# Patient Record
Sex: Female | Born: 1995 | Race: Black or African American | Hispanic: No | Marital: Single | Smoking: Never smoker
Health system: Southern US, Community
[De-identification: ages and names within clinical notes are randomized; demographics above are authoritative.]

## PROBLEM LIST (undated history)

## (undated) DIAGNOSIS — Z87442 Personal history of urinary calculi: Secondary | ICD-10-CM

## (undated) DIAGNOSIS — J189 Pneumonia, unspecified organism: Secondary | ICD-10-CM

## (undated) DIAGNOSIS — Z789 Other specified health status: Secondary | ICD-10-CM

## (undated) HISTORY — PX: WISDOM TOOTH EXTRACTION: SHX21

---
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
## 2019-09-15 ENCOUNTER — Emergency Department (HOSPITAL_COMMUNITY): Payer: PRIVATE HEALTH INSURANCE

## 2019-09-15 ENCOUNTER — Emergency Department (HOSPITAL_COMMUNITY)
Admission: EM | Admit: 2019-09-15 | Discharge: 2019-09-15 | Disposition: A | Discharge status: Home / Self Care | Payer: PRIVATE HEALTH INSURANCE | Attending: Emergency Medicine | Admitting: Emergency Medicine

## 2019-09-15 ENCOUNTER — Other Ambulatory Visit: Payer: Self-pay

## 2019-09-15 ENCOUNTER — Encounter (HOSPITAL_COMMUNITY): Payer: Self-pay

## 2019-09-15 DIAGNOSIS — Z79899 Other long term (current) drug therapy: Secondary | ICD-10-CM | POA: Diagnosis not present

## 2019-09-15 DIAGNOSIS — B3731 Acute candidiasis of vulva and vagina: Secondary | ICD-10-CM

## 2019-09-15 DIAGNOSIS — B373 Candidiasis of vulva and vagina: Secondary | ICD-10-CM

## 2019-09-15 DIAGNOSIS — R102 Pelvic and perineal pain: Secondary | ICD-10-CM | POA: Insufficient documentation

## 2019-09-15 DIAGNOSIS — N201 Calculus of ureter: Secondary | ICD-10-CM | POA: Insufficient documentation

## 2019-09-15 DIAGNOSIS — R1031 Right lower quadrant pain: Secondary | ICD-10-CM | POA: Diagnosis present

## 2019-09-15 LAB — COMPREHENSIVE METABOLIC PANEL
ALT: 11 U/L (ref 0–44)
AST: 16 U/L (ref 15–41)
Albumin: 3.9 g/dL (ref 3.5–5.0)
Alkaline Phosphatase: 45 U/L (ref 38–126)
Anion gap: 13 (ref 5–15)
BUN: 9 mg/dL (ref 6–20)
CO2: 24 mmol/L (ref 22–32)
Calcium: 8.9 mg/dL (ref 8.9–10.3)
Chloride: 104 mmol/L (ref 98–111)
Creatinine, Ser: 0.87 mg/dL (ref 0.44–1.00)
GFR calc Af Amer: >60 mL/min (ref >60)
GFR calc non Af Amer: >60 mL/min (ref >60)
Glucose, Bld: 103 mg/dL — ABNORMAL HIGH (ref 70–99)
Potassium: 3.4 mmol/L — ABNORMAL LOW (ref 3.5–5.1)
Sodium: 141 mmol/L (ref 135–145)
Total Bilirubin: 0.8 mg/dL (ref 0.3–1.2)
Total Protein: 6.7 g/dL (ref 6.5–8.1)

## 2019-09-15 LAB — URINALYSIS, ROUTINE W REFLEX MICROSCOPIC
Bacteria, UA: NONE SEEN
Bilirubin Urine: NEGATIVE
Glucose, UA: NEGATIVE mg/dL
Ketones, ur: 5 mg/dL — AB
Nitrite: NEGATIVE
Protein, ur: 100 mg/dL — AB
Specific Gravity, Urine: 1.03 (ref 1.005–1.030)
pH: 5 (ref 5.0–8.0)

## 2019-09-15 LAB — I-STAT BETA HCG BLOOD, ED (MC, WL, AP ONLY): I-stat hCG, quantitative: <5 m[IU]/mL (ref <5)

## 2019-09-15 LAB — CBC
HCT: 34.9 % — ABNORMAL LOW (ref 36.0–46.0)
Hemoglobin: 11.6 g/dL — ABNORMAL LOW (ref 12.0–15.0)
MCH: 30.9 pg (ref 26.0–34.0)
MCHC: 33.2 g/dL (ref 30.0–36.0)
MCV: 93.1 fL (ref 80.0–100.0)
Platelets: 222 10*3/uL (ref 150–400)
RBC: 3.75 MIL/uL — ABNORMAL LOW (ref 3.87–5.11)
RDW: 11.9 % (ref 11.5–15.5)
WBC: 9 10*3/uL (ref 4.0–10.5)
nRBC: 0 % (ref 0.0–0.2)

## 2019-09-15 LAB — WET PREP, GENITAL
Clue Cells Wet Prep HPF POC: NONE SEEN
Sperm: NONE SEEN
Trich, Wet Prep: NONE SEEN

## 2019-09-15 LAB — LIPASE, BLOOD: Lipase: 22 U/L (ref 11–51)

## 2019-09-15 MED ORDER — KETOROLAC TROMETHAMINE 30 MG/ML IJ SOLN
30.0000 mg | Freq: Once | INTRAMUSCULAR | Status: AC
  Administered 2019-09-15: 30 mg via INTRAVENOUS
  Filled 2019-09-15: qty 1

## 2019-09-15 MED ORDER — HYDROCODONE-ACETAMINOPHEN 5-325 MG PO TABS: 1.0000 | ORAL_TABLET | ORAL | 0 refills | Status: DC | PRN

## 2019-09-15 MED ORDER — FLUCONAZOLE 150 MG PO TABS: 150.0000 mg | ORAL_TABLET | Freq: Every day | ORAL | 0 refills | Status: AC

## 2019-09-15 MED ORDER — IOHEXOL 300 MG/ML  SOLN
100.0000 mL | Freq: Once | INTRAMUSCULAR | Status: AC | PRN
  Administered 2019-09-15: 100 mL via INTRAVENOUS

## 2019-09-15 MED ORDER — ONDANSETRON HCL 4 MG/2ML IJ SOLN
4.0000 mg | Freq: Once | INTRAMUSCULAR | Status: AC
  Administered 2019-09-15: 4 mg via INTRAVENOUS
  Filled 2019-09-15: qty 2

## 2019-09-15 MED ORDER — MORPHINE SULFATE (PF) 4 MG/ML IV SOLN
4.0000 mg | Freq: Once | INTRAVENOUS | Status: AC
  Administered 2019-09-15: 4 mg via INTRAVENOUS
  Filled 2019-09-15: qty 1

## 2019-09-15 MED ORDER — SODIUM CHLORIDE 0.9% FLUSH: 3.0000 mL | Freq: Once | INTRAVENOUS | Status: DC

## 2019-09-15 MED ORDER — ONDANSETRON 4 MG PO TBDP: 4.0000 mg | ORAL_TABLET | Freq: Three times a day (TID) | ORAL | 0 refills | Status: DC | PRN

## 2019-09-15 NOTE — ED Notes (Signed)
Pt transported to CT ?

## 2019-09-15 NOTE — ED Notes (Signed)
Pt transported to US

## 2019-09-15 NOTE — ED Provider Notes (Signed)
[facility] EMERGENCY DEPARTMENT Provider Note   CSN: [id number] Arrival date & time: 09/15/19  5366     History Chief Complaint  Patient presents with  . Abdominal Pain    Yvette Henderson is a 24 y.o. female.  24yo female with RLQ abdominal pain onset 9PM last night, difficulty sleeping secondary to pain. Pain radiates to right lower back. Reports urinary frequency last week that resolved, no dysuria, no changes in bowel habits. Denies fevers, vaginal discharge, nausea, vomiting. Patient took IBU without improvement. Reports history of ovarian cyst in 2016. No prior abdominal surgeries.         Past Medical History:  Diagnosis Date  . Medical history non-contributory     Patient Active Problem List   Diagnosis Date Noted  . Depression with suicidal ideation 11/14/2014  . Suicide attempt by drug ingestion ([place]) 11/14/2014  . 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 Generalized anxiety disorder 11/14/2014    History reviewed. No pertinent surgical history.   OB History   No obstetric history on file.     No family history on file.  Social History   Tobacco Use  . Smoking status: Never Smoker  . Smokeless tobacco: Never Used  Substance Use Topics  . Alcohol use: No  . Drug use: No    Home Medications Prior to Admission medications   Medication Sig Start Date End Date Taking? Authorizing Provider  cetirizine (ZYRTEC) 10 MG tablet Take 10 mg by mouth daily.   Yes [provider]  ibuprofen (ADVIL) 200 MG tablet Take 400 mg by mouth every 6 (six) hours as needed for mild pain.   Yes [provider]  metroNIDAZOLE (METROGEL) 0.75 % vaginal gel Place 1 Applicatorful vaginally at bedtime. For 5 days 09/13/19  Yes [provider]  valACYclovir (VALTREX) 1000 MG tablet Take 1,000 mg by mouth daily. 07/08/19  Yes [provider]  fluconazole (DIFLUCAN) 150 MG tablet Take 1 tablet (150 mg total) by  mouth daily for 1 dose. 09/15/19 09/16/19  Tacy Learn, PA-C  HYDROcodone-acetaminophen (NORCO/VICODIN) 5-325 MG tablet Take 1 tablet by mouth every 4 (four) hours as needed. 09/15/19   Tacy Learn, PA-C  ondansetron (ZOFRAN ODT) 4 MG disintegrating tablet Take 1 tablet (4 mg total) by mouth every 8 (eight) hours as needed for nausea or vomiting. 09/15/19   Tacy Learn, PA-C    Allergies    Amoxicillin  Review of Systems   Review of Systems  Constitutional: Negative for chills and fever.  Respiratory: Negative for shortness of breath.   Cardiovascular: Negative for chest pain.  Gastrointestinal: Positive for abdominal pain. Negative for constipation, diarrhea, nausea and vomiting.  Genitourinary: Positive for pelvic pain. Negative for dysuria and frequency.  Musculoskeletal: Positive for back pain.  Skin: Negative for rash and wound.  Allergic/Immunologic: Negative for immunocompromised state.  Neurological: Negative for weakness.  Hematological: Negative for adenopathy.  All other systems reviewed and are negative.   Physical Exam Updated Vital Signs BP 106/74   Pulse 68   Temp 97.9 F (36.6 C) (Oral)   Resp 12   Ht 5\' 6"  (1.676 m)   Wt 53.1 kg   SpO2 97%   BMI 18.88 kg/m   Physical Exam Vitals and nursing note reviewed. Exam conducted with a chaperone present.  Constitutional:      General: She is not in acute distress.    Appearance: She is well-developed. She is not diaphoretic.  HENT:     Head: Normocephalic and atraumatic.  Cardiovascular:     Rate and Rhythm: Normal rate and regular rhythm.     Heart sounds: Normal heart sounds.  Pulmonary:     Effort: Pulmonary effort is normal.     Breath sounds: Normal breath sounds.  Abdominal:     Palpations: Abdomen is soft.     Tenderness: There is abdominal tenderness in the right lower quadrant and suprapubic area. There is no right CVA tenderness or left CVA tenderness.  Genitourinary:    Vagina: Vaginal  discharge present.     Cervix: No cervical motion tenderness or friability.     Uterus: Normal. Not tender.      Adnexa:        Right: Tenderness present. No mass or fullness.         Left: No mass, tenderness or fullness.       Comments: Mild right adnexal tenderness, trace amount thick white discharge Skin:    General: Skin is warm and dry.     Findings: No rash.  Neurological:     Mental Status: She is alert and oriented to person, place, and time.  Psychiatric:        Behavior: Behavior normal.     ED Results / Procedures / Treatments   Labs (all labs ordered are listed, but only abnormal results are displayed) Labs Reviewed  WET PREP, GENITAL - Abnormal; Notable for the following components:      Result Value   Yeast Wet Prep HPF POC PRESENT (*)    WBC, Wet Prep HPF POC MODERATE (*)    All other components within normal limits  COMPREHENSIVE METABOLIC PANEL - Abnormal; Notable for the following components:   Potassium 3.4 (*)    Glucose, Bld 103 (*)    All other components within normal limits  CBC - Abnormal; Notable for the following components:   RBC 3.75 (*)    Hemoglobin 11.6 (*)    HCT 34.9 (*)    All other components within normal limits  URINALYSIS, ROUTINE W REFLEX MICROSCOPIC - Abnormal; Notable for the following components:   APPearance CLOUDY (*)    Hgb urine dipstick MODERATE (*)    Ketones, ur 5 (*)    Protein, ur 100 (*)    Leukocytes,Ua MODERATE (*)    All other components within normal limits  LIPASE, BLOOD  I-STAT BETA HCG BLOOD, ED ([facility], [facility], [facility] ONLY)  GC/CHLAMYDIA PROBE AMP ([place]) NOT AT [facility]    EKG None  Radiology CT Abdomen Pelvis W Contrast  Result Date: 09/15/2019 CLINICAL DATA:  Right lower quadrant abdominal pain. EXAM: CT ABDOMEN AND PELVIS WITH CONTRAST TECHNIQUE: Multidetector CT imaging of the abdomen and pelvis was performed using the standard protocol following bolus administration of intravenous contrast. CONTRAST:   OMNIPAQUE IOHEXOL 300 MG/ML  SOLN COMPARISON:  Pelvic ultrasound 09/15/2019 FINDINGS: Lower chest: Incidental imaging of the lung bases is normal. Hepatobiliary: Mild periportal edema. Patent portal vein. Patent patent veins. No pericholecystic stranding. No biliary ductal dilation. Pancreas: Pancreas is normal. No signs of inflammation or ductal dilation. Spleen: Spleen is normal size. Adrenals/Urinary Tract: Adrenal glands are normal. Moderate right hydroureteronephrosis with 3 mm calculus in the distal ureter at the UVJ on the right. Delayed enhancement of the right kidney relative to the left. Nonfocal perinephric stranding and fluid on the right, also Peri ureteral stranding. No evidence of left-sided hydronephrosis. Urinary bladder is normal. Stomach/Bowel: No signs of  bowel obstruction. Assessment of bowel loops is limited due to limited intra-abdominal fat. Stool in the ascending colon. Cecum in the pelvis. Appendix not visualized also with limited assessment due to paucity of intra-abdominal fat. No secondary signs to suggest acute appendicitis. Vascular/Lymphatic: Vascular structures in the abdomen are patent. No signs of aneurysm or significant atherosclerosis. No adenopathy in the upper abdomen or retroperitoneum. No adenopathy in the pelvis. Reproductive: CT appearance of uterus and adnexa is unremarkable. Other: Small free fluid in the pelvis, nonspecific. Musculoskeletal: No acute bone finding or evidence of destructive bone process. IMPRESSION: Moderate right hydroureteronephrosis with 3 mm calculus in the distal ureter at the UVJ on the right, obstruction associated with delayed enhancement of the right kidney relative to the left. Electronically Signed   By: Donzetta Kohut M.D.   On: 09/15/2019 11:18   US PELVIC COMPLETE W TRANSVAGINAL AND TORSION R/O  Result Date: 09/15/2019 CLINICAL DATA:  Right lower quadrant pain since last 9 EXAM: TRANSABDOMINAL AND TRANSVAGINAL ULTRASOUND OF PELVIS  DOPPLER ULTRASOUND OF OVARIES TECHNIQUE: Both transabdominal and transvaginal ultrasound examinations of the pelvis were performed. Transabdominal technique was performed for global imaging of the pelvis including uterus, ovaries, adnexal regions, and pelvic cul-de-sac. It was necessary to proceed with endovaginal exam following the transabdominal exam to visualize the left and right ovaries. Color and duplex Doppler ultrasound was utilized to evaluate blood flow to the ovaries. COMPARISON:  04/07/2014 FINDINGS: Uterus Measurements: 6.4 x 2.9 x 3.8 cm = volume: 37 mL. No fibroids or other mass visualized. Endometrium Thickness: 6 mm.  No focal abnormality visualized. Right ovary Measurements: 4.1 x 1.8 x 2.4 cm = volume: 9 mL. Multiple small follicles. Normal appearance/no adnexal mass. Left ovary Measurements: 3.6 x 2.1 x 2.6 cm = volume: 9 mL. Multiple small follicles. Normal appearance/no adnexal mass. Pulsed Doppler evaluation of both ovaries demonstrates normal low-resistance arterial and venous waveforms. Other findings No abnormal free fluid. IMPRESSION: No ultrasound abnormality of the pelvis to explain right lower quadrant pain. Consider CT or MRI to further evaluate otherwise unexplained abdominal pain. Electronically Signed   By: Lauralyn Primes M.D.   On: 09/15/2019 09:22    Procedures Procedures (including critical care time)  Medications Ordered in ED Medications  ondansetron (ZOFRAN) injection 4 mg (4 mg Intravenous Given 09/15/19 0811)  morphine 4 MG/ML injection 4 mg (4 mg Intravenous Given 09/15/19 0811)  iohexol (OMNIPAQUE) 300 MG/ML solution 100 mL (100 mLs Intravenous Contrast Given 09/15/19 1101)  morphine 4 MG/ML injection 4 mg (4 mg Intravenous Given 09/15/19 1124)  ketorolac (TORADOL) 30 MG/ML injection 30 mg (30 mg Intravenous Given 09/15/19 1210)    ED Course  I have reviewed the triage vital signs and the nursing notes.  Pertinent labs & imaging results that were available  during my care of the patient were reviewed by me and considered in my medical decision making (see chart for details).  Clinical Course as of Sep 16 2010  Thu Sep 15, 2019  5754 24 year old female presents with complaint of right lower quadrant pain onset 9 AM last night.  On exam, was tenderness in the right lower quadrant, no CVA tenderness.  On pelvic exam has mild amount of white discharge, wet prep is positive for yeast, will treat with Diflucan.  CBC without significant changes.  Urinalysis with moderate hemoglobin, ketones, protein leukocytes urinary symptoms.  Urinalysis without acute findings to explain patient's pain.  CT abdomen pelvis with contrast, appendix not visualized however no signs of  acute funisitis.  Does show 3 mm distal UVJ stone with with hydro-.  Patient will be given Toradol for better pain control, if tolerating p.o. symptom control she may be discharged with urology as needed.  Prescription for Norco, Zofran for pain and nausea.  Given return to ER precautions.   [LM]  0920 US PELVIC COMPLETE W TRANSVAGINAL AND TORSION R/O [ES]    Clinical Course User Index [ES] Myer Haff, Student-PA [LM] Eulah Pont Barbaraann Boys   MDM Rules/Calculators/A&P                      Final Clinical Impression(s) / ED Diagnoses Final diagnoses:  Ureterolithiasis  Vaginal yeast infection    Rx / DC Orders ED Discharge Orders         Ordered    HYDROcodone-acetaminophen (NORCO/VICODIN) 5-325 MG tablet  Every 4 hours PRN     09/15/19 1135    ondansetron (ZOFRAN ODT) 4 MG disintegrating tablet  Every 8 hours PRN     09/15/19 1135    fluconazole (DIFLUCAN) 150 MG tablet  Daily     09/15/19 1139           Alden Hipp 09/16/19 2013    Mesner, Barbara Cower, MD 09/17/19 [phone]

## 2019-09-15 NOTE — ED Triage Notes (Signed)
Pt c/o of lower abd pain that started last night, denies n/v/d, denies urinary symptoms or discharge.

## 2019-09-15 NOTE — ED Notes (Signed)
Pt d/c home per MD order. Discharge summary reviewed with pt, pt verbalizes understanding. Off unit via [facility] with nursing staff. Pt mother discharge ride home.

## 2019-09-15 NOTE — Discharge Instructions (Signed)
Take Zofran as needed as prescribed for nausea and vomiting. Take Norco as needed as prescribed for pain. Do not drive or operate machinery if taking Norco. Norco and Zofran can cause constipation, take Colace as needed.  Follow up with urology if pain is not improving. Return to the ER for fever, worsening pain, pain or vomiting not controlled with the medications given.

## 2019-09-15 NOTE — ED Notes (Signed)
Patient transported back from CT 

## 2019-09-15 NOTE — ED Notes (Signed)
Pelvic cart outside of pt. room

## 2019-09-17 LAB — GC/CHLAMYDIA PROBE AMP (~~LOC~~) NOT AT ARMC
Chlamydia: NEGATIVE
Neisseria Gonorrhea: NEGATIVE

## 2019-10-05 ENCOUNTER — Other Ambulatory Visit: Payer: Self-pay | Admitting: Urology

## 2019-10-07 NOTE — Patient Instructions (Signed)
DUE TO COVID-19 ONLY ONE VISITOR IS ALLOWED TO COME WITH YOU AND STAY IN THE WAITING ROOM ONLY DURING PRE OP AND PROCEDURE DAY OF SURGERY. THE 1 VISITOR MAY VISIT WITH YOU AFTER SURGERY IN YOUR PRIVATE ROOM DURING VISITING HOURS ONLY!  YOU NEED TO HAVE A COVID 19 TEST ON_4/15______ @_2 :20______, THIS TEST MUST BE DONE BEFORE SURGERY, COME  [place] , [address].  ([place]) ONCE YOUR COVID TEST IS COMPLETED, PLEASE BEGIN THE QUARANTINE INSTRUCTIONS AS OUTLINED IN YOUR HANDOUT.                Yvette Henderson    Your procedure is scheduled on: 10/17/19   Report to [facility] Main  Entrance   Report to admitting at  2:00 PM     Call this number if you have problems the morning of surgery [phone]    Remember: Do not eat food or drink liquids :After Midnight.   BRUSH YOUR TEETH MORNING OF SURGERY AND RINSE YOUR MOUTH OUT, NO CHEWING GUM CANDY OR MINTS.     Take these medicines the morning of surgery with A SIP OF WATER: Valtrex, Zyrtec                                 You may not have any metal on your body including hair pins and              piercings  Do not wear jewelry, make-up, lotions, powders or perfumes, deodorant             Do not wear nail polish on your fingernails.  Do not shave  48 hours prior to surgery.     Do not bring valuables to the hospital. [place].  Contacts, dentures or bridgework may not be worn into surgery.       Patients discharged the day of surgery will not be allowed to drive home . IF YOU ARE HAVING SURGERY AND GOING HOME THE SAME DAY, YOU MUST HAVE AN ADULT TO DRIVE YOU HOME AND BE WITH YOU FOR 24 HOURS . YOU MAY GO HOME BY TAXI OR UBER OR ORTHERWISE, BUT AN ADULT MUST ACCOMPANY YOU HOME AND STAY WITH YOU FOR 24 HOURS.  Name and phone number of your driver:  Special Instructions: N/A              Please read over the following fact sheets you were  given: _____________________________________________________________________             [facility] - Preparing for Surgery Before surgery, you can play an important role.  Because skin is not sterile, your skin needs to be as free of germs as possible.  You can reduce the number of germs on your skin by washing with CHG (chlorahexidine gluconate) soap before surgery.  CHG is an antiseptic cleaner which kills germs and bonds with the skin to continue killing germs even after washing. Please DO NOT use if you have an allergy to CHG or antibacterial soaps.  If your skin becomes reddened/irritated stop using the CHG and inform your nurse when you arrive at Short Stay. Do not shave (including legs and underarms) for at least 48 hours prior to the first CHG shower.  You may shave your face/neck. Please follow  these instructions carefully:  1.  Shower with CHG Soap the night before surgery and the  morning of Surgery.  2.  If you choose to wash your hair, wash your hair first as usual with your  normal  shampoo.  3.  After you shampoo, rinse your hair and body thoroughly to remove the  shampoo.                                        4.  Use CHG as you would any other liquid soap.  You can apply chg directly  to the skin and wash                       Gently with a scrungie or clean washcloth.  5.  Apply the CHG Soap to your body ONLY FROM THE NECK DOWN.   Do not use on face/ open                           Wound or open sores. Avoid contact with eyes, ears mouth and genitals (private parts).                       Wash face,  Genitals (private parts) with your normal soap.             6.  Wash thoroughly, paying special attention to the area where your surgery  will be performed.  7.  Thoroughly rinse your body with warm water from the neck down.  8.  DO NOT shower/wash with your normal soap after using and rinsing off  the CHG Soap.                9.  Pat yourself dry with a clean towel.             10.  Wear clean pajamas.            11.  Place clean sheets on your bed the night of your first shower and do not  sleep with pets. Day of Surgery : Do not apply any lotions/deodorants the morning of surgery.  Please wear clean clothes to the hospital/surgery center.  FAILURE TO FOLLOW THESE INSTRUCTIONS MAY RESULT IN THE CANCELLATION OF YOUR SURGERY PATIENT SIGNATURE_________________________________  NURSE SIGNATURE__________________________________  ________________________________________________________________________

## 2019-10-10 ENCOUNTER — Encounter (HOSPITAL_COMMUNITY)
Admission: RE | Admit: 2019-10-10 | Discharge: 2019-10-10 | Disposition: A | Discharge status: Home / Self Care | Payer: PRIVATE HEALTH INSURANCE | Source: Ambulatory Visit | Attending: Urology | Admitting: Urology

## 2019-10-10 ENCOUNTER — Encounter (HOSPITAL_COMMUNITY): Admission: RE | Admit: 2019-10-10 | Payer: PRIVATE HEALTH INSURANCE | Source: Ambulatory Visit

## 2019-10-10 NOTE — Progress Notes (Signed)
Pt did not answer her phone for the PAT phone call. I waited for her to come in for her lab visit but she was a no Show. I called  Dr. Vevelyn Royals office to let them know and gave the chart to the scheduler to reschedule appts.

## 2019-10-12 ENCOUNTER — Encounter (HOSPITAL_COMMUNITY)
Admission: RE | Admit: 2019-10-12 | Discharge: 2019-10-12 | Disposition: A | Discharge status: Home / Self Care | Payer: PRIVATE HEALTH INSURANCE | Source: Ambulatory Visit | Attending: Urology | Admitting: Urology

## 2019-10-12 ENCOUNTER — Encounter (HOSPITAL_COMMUNITY): Payer: Self-pay

## 2019-10-12 ENCOUNTER — Other Ambulatory Visit: Payer: Self-pay

## 2019-10-12 HISTORY — DX: Pneumonia, unspecified organism: J18.9

## 2019-10-12 HISTORY — DX: Personal history of urinary calculi: Z87.442

## 2019-10-12 NOTE — Pre-Procedure Instructions (Signed)
DUE TO COVID-19 ONLY ONE VISITOR IS ALLOWED TO COME WITH YOU AND STAY IN THE WAITING ROOM ONLY DURING PRE OP AND PROCEDURE DAY OF SURGERY. THE 1 VISITOR MAY VISIT WITH YOU AFTER SURGERY IN YOUR PRIVATE ROOM DURING VISITING HOURS ONLY!  YOU NEED TO HAVE A COVID 19 TEST ON_______ @_______ , THIS TEST MUST BE DONE BEFORE SURGERY, COME  [address], [place] , .  [facility]) ONCE YOUR COVID TEST IS COMPLETED, PLEASE BEGIN THE QUARANTINE INSTRUCTIONS AS OUTLINED IN YOUR HANDOUT.                Yvette Henderson  10/12/2019   Your procedure is scheduled on:  10/17/2019   Report to [facility] Main  Entrance   Report to admitting at   200pm     Call this number if you have problems the morning of surgery [phone]    Remember: Do not eat food   :After Midnight May have clear liquids from 12 midnite until 12noon.  06-09-1996 BRUSH YOUR TEETH MORNING OF SURGERY AND RINSE YOUR MOUTH OUT, NO CHEWING GUM CANDY OR MINTS.     Take these medicines the morning of surgery with A SIP OF WATER: none                                  You may not have any metal on your body including hair pins and              piercings  Do not wear jewelry, make-up, lotions, powders or perfumes, deodorant             Do not wear nail polish on your fingernails.  Do not shave  48 hours prior to surgery.                 Do not bring valuables to the hospital. [place] IS NOT             RESPONSIBLE   FOR VALUABLES.  Contacts, dentures or bridgework may not be worn into surgery.      Patients discharged the day of surgery will not be allowed to drive home. IF YOU ARE HAVING SURGERY AND GOING HOME THE SAME DAY, YOU MUST HAVE AN ADULT TO DRIVE YOU HOME AND BE WITH YOU FOR 24 HOURS. YOU MAY GO HOME BY TAXI OR UBER OR ORTHERWISE, BUT AN ADULT MUST ACCOMPANY YOU HOME AND STAY WITH YOU FOR 24 HOURS.  Name and phone number of your driver: mother - Jiselle Sheu- [phone]                Please  read over the following fact sheets you were given: _____________________________________________________________________                CLEAR LIQUID DIET   Foods Allowed                                                                     Foods Excluded  Coffee and tea, regular and decaf  liquids that you cannot  Plain Jell-O any favor except red or purple                                           see through such as: Fruit ices (not with fruit pulp)                                     milk, soups, orange juice  Iced Popsicles                                    All solid food Carbonated beverages, regular and diet                                    Cranberry, grape and apple juices Sports drinks like Gatorade Lightly seasoned clear broth or consume(fat free) Sugar, honey syrup  Sample Menu Breakfast                                Lunch                                     Supper Cranberry juice                    Beef broth                            Chicken broth Jell-O                                     Grape juice                           Apple juice Coffee or tea                        Jell-O                                      Popsicle                                                Coffee or tea                        Coffee or tea  _____________________________________________________________________  [facility] - Preparing for Surgery Before surgery, you can play an important role.  Because skin is not sterile, your skin needs to be as free of germs as possible.  You can reduce the number of germs on your skin by washing with CHG (chlorahexidine gluconate) soap before surgery.  CHG is an antiseptic cleaner which kills  germs and bonds with the skin to continue killing germs even after washing. Please DO NOT use if you have an allergy to CHG or antibacterial soaps.  If your skin becomes reddened/irritated stop using the CHG and inform your nurse  when you arrive at Short Stay. Do not shave (including legs and underarms) for at least 48 hours prior to the first CHG shower.  You may shave your face/neck. Please follow these instructions carefully:  1.  Shower with CHG Soap the night before surgery and the  morning of Surgery.  2.  If you choose to wash your hair, wash your hair first as usual with your  normal  shampoo.  3.  After you shampoo, rinse your hair and body thoroughly to remove the  shampoo.                           4.  Use CHG as you would any other liquid soap.  You can apply chg directly  to the skin and wash                       Gently with a scrungie or clean washcloth.  5.  Apply the CHG Soap to your body ONLY FROM THE NECK DOWN.   Do not use on face/ open                           Wound or open sores. Avoid contact with eyes, ears mouth and genitals (private parts).                       Wash face,  Genitals (private parts) with your normal soap.             6.  Wash thoroughly, paying special attention to the area where your surgery  will be performed.  7.  Thoroughly rinse your body with warm water from the neck down.  8.  DO NOT shower/wash with your normal soap after using and rinsing off  the CHG Soap.                9.  Pat yourself dry with a clean towel.            10.  Wear clean pajamas.            11.  Place clean sheets on your bed the night of your first shower and do not  sleep with pets. Day of Surgery : Do not apply any lotions/deodorants the morning of surgery.  Please wear clean clothes to the hospital/surgery center.  FAILURE TO FOLLOW THESE INSTRUCTIONS MAY RESULT IN THE CANCELLATION OF YOUR SURGERY PATIENT SIGNATURE_________________________________  NURSE SIGNATURE__________________________________  ________________________________________________________________________

## 2019-10-12 NOTE — Patient Instructions (Signed)
DUE TO COVID-19 ONLY ONE VISITOR IS ALLOWED TO COME WITH YOU AND STAY IN THE WAITING ROOM ONLY DURING PRE OP AND PROCEDURE DAY OF SURGERY. THE 1 VISITOR MAY VISIT WITH YOU AFTER SURGERY IN YOUR PRIVATE ROOM DURING VISITING HOURS ONLY!  YOU NEED TO HAVE A COVID 19 TEST ON_______ @_______ , THIS TEST MUST BE DONE BEFORE SURGERY, COME  [name] , [address].  ([place]) ONCE YOUR COVID TEST IS COMPLETED, PLEASE BEGIN THE QUARANTINE INSTRUCTIONS AS OUTLINED IN YOUR HANDOUT.                Yvette Henderson  10/12/2019   Your procedure is scheduled on: 10/17/2019    Report to [facility] Main  Entrance   Report to admitting at  200pm     Call this number if you have problems the morning of surgery [phone]    Remember: Do not eat food  :After Midnight.may have clear liquids from 55midnite until 1200noon day of surgery.   BRUSH YOUR TEETH MORNING OF SURGERY AND RINSE YOUR MOUTH OUT, NO CHEWING GUM CANDY OR MINTS.     Take these medicines the morning of surgery with A SIP OF WATER: none                                  You may not have any metal on your body including hair pins and              piercings  Do not wear jewelry, make-up, lotions, powders or perfumes, deodorant             Do not wear nail polish on your fingernails.  Do not shave  48 hours prior to surgery.              Do not bring valuables to the hospital. [place].  Contacts, dentures or bridgework may not be worn into surgery.      Patients discharged the day of surgery will not be allowed to drive home. IF YOU ARE HAVING SURGERY AND GOING HOME THE SAME DAY, YOU MUST HAVE AN ADULT TO DRIVE YOU HOME AND BE WITH YOU FOR 24 HOURS. YOU MAY GO HOME BY TAXI OR UBER OR ORTHERWISE, BUT AN ADULT MUST ACCOMPANY YOU HOME AND STAY WITH YOU FOR 24 HOURS.  Name and phone number of your driver:mother Cerra Eisenhower - [phone]                 Please read over the following fact sheets you were given: _____________________________________________________________________                CLEAR LIQUID DIET   Foods Allowed                                                                     Foods Excluded  Coffee and tea, regular and decaf  liquids that you cannot  Plain Jell-O any favor except red or purple                                           see through such as: Fruit ices (not with fruit pulp)                                     milk, soups, orange juice  Iced Popsicles                                    All solid food Carbonated beverages, regular and diet                                    Cranberry, grape and apple juices Sports drinks like Gatorade Lightly seasoned clear broth or consume(fat free) Sugar, honey syrup  Sample Menu Breakfast                                Lunch                                     Supper Cranberry juice                    Beef broth                            Chicken broth Jell-O                                     Grape juice                           Apple juice Coffee or tea                        Jell-O                                      Popsicle                                                Coffee or tea                        Coffee or tea  _____________________________________________________________________  [facility] - Preparing for Surgery Before surgery, you can play an important role.  Because skin is not sterile, your skin needs to be as free of germs as possible.  You can reduce the number of germs on your skin by washing with CHG (chlorahexidine gluconate) soap before surgery.  CHG is an antiseptic cleaner which kills  germs and bonds with the skin to continue killing germs even after washing. Please DO NOT use if you have an allergy to CHG or antibacterial soaps.  If your skin becomes reddened/irritated stop using the CHG and inform your  nurse when you arrive at Short Stay. Do not shave (including legs and underarms) for at least 48 hours prior to the first CHG shower.  You may shave your face/neck. Please follow these instructions carefully:  1.  Shower with CHG Soap the night before surgery and the  morning of Surgery.  2.  If you choose to wash your hair, wash your hair first as usual with your  normal  shampoo.  3.  After you shampoo, rinse your hair and body thoroughly to remove the  shampoo.                           4.  Use CHG as you would any other liquid soap.  You can apply chg directly  to the skin and wash                       Gently with a scrungie or clean washcloth.  5.  Apply the CHG Soap to your body ONLY FROM THE NECK DOWN.   Do not use on face/ open                           Wound or open sores. Avoid contact with eyes, ears mouth and genitals (private parts).                       Wash face,  Genitals (private parts) with your normal soap.             6.  Wash thoroughly, paying special attention to the area where your surgery  will be performed.  7.  Thoroughly rinse your body with warm water from the neck down.  8.  DO NOT shower/wash with your normal soap after using and rinsing off  the CHG Soap.                9.  Pat yourself dry with a clean towel.            10.  Wear clean pajamas.            11.  Place clean sheets on your bed the night of your first shower and do not  sleep with pets. Day of Surgery : Do not apply any lotions/deodorants the morning of surgery.  Please wear clean clothes to the hospital/surgery center.  FAILURE TO FOLLOW THESE INSTRUCTIONS MAY RESULT IN THE CANCELLATION OF YOUR SURGERY PATIENT SIGNATURE_________________________________  NURSE SIGNATURE__________________________________  ________________________________________________________________________

## 2019-10-13 ENCOUNTER — Other Ambulatory Visit (HOSPITAL_COMMUNITY)
Admission: RE | Admit: 2019-10-13 | Discharge: 2019-10-13 | Disposition: A | Discharge status: Home / Self Care | Payer: PRIVATE HEALTH INSURANCE | Source: Ambulatory Visit | Attending: Urology | Admitting: Urology

## 2019-10-13 ENCOUNTER — Encounter (HOSPITAL_COMMUNITY)
Admission: RE | Admit: 2019-10-13 | Discharge: 2019-10-13 | Disposition: A | Discharge status: Home / Self Care | Payer: PRIVATE HEALTH INSURANCE | Source: Ambulatory Visit | Attending: Urology | Admitting: Urology

## 2019-10-13 DIAGNOSIS — Z20822 Contact with and (suspected) exposure to covid-19: Secondary | ICD-10-CM | POA: Diagnosis not present

## 2019-10-13 DIAGNOSIS — Z01812 Encounter for preprocedural laboratory examination: Secondary | ICD-10-CM | POA: Insufficient documentation

## 2019-10-13 LAB — CBC
HCT: 35.7 % — ABNORMAL LOW (ref 36.0–46.0)
Hemoglobin: 11.9 g/dL — ABNORMAL LOW (ref 12.0–15.0)
MCH: 30.5 pg (ref 26.0–34.0)
MCHC: 33.3 g/dL (ref 30.0–36.0)
MCV: 91.5 fL (ref 80.0–100.0)
Platelets: 214 10*3/uL (ref 150–400)
RBC: 3.9 MIL/uL (ref 3.87–5.11)
RDW: 12 % (ref 11.5–15.5)
WBC: 8.3 10*3/uL (ref 4.0–10.5)
nRBC: 0 % (ref 0.0–0.2)

## 2019-10-13 LAB — BASIC METABOLIC PANEL
Anion gap: 7 (ref 5–15)
BUN: 10 mg/dL (ref 6–20)
CO2: 29 mmol/L (ref 22–32)
Calcium: 8.5 mg/dL — ABNORMAL LOW (ref 8.9–10.3)
Chloride: 103 mmol/L (ref 98–111)
Creatinine, Ser: 0.65 mg/dL (ref 0.44–1.00)
GFR calc Af Amer: >60 mL/min (ref >60)
GFR calc non Af Amer: >60 mL/min (ref >60)
Glucose, Bld: 98 mg/dL (ref 70–99)
Potassium: 4.3 mmol/L (ref 3.5–5.1)
Sodium: 139 mmol/L (ref 135–145)

## 2019-10-13 LAB — SARS CORONAVIRUS 2 (TAT 6-24 HRS): SARS Coronavirus 2: NEGATIVE

## 2019-10-13 LAB — HCG, SERUM, QUALITATIVE: Preg, Serum: NEGATIVE

## 2019-10-14 NOTE — H&P (Signed)
Office Visit Report     10/05/2019   --------------------------------------------------------------------------------   Yvette Henderson  MRN: [id number]  DOB: Jan 19, 1996, 24 year old Female  SSN:    PRIMARY CARE:  [facility], [place]  REFERRING:    PROVIDER:  Raynelle Bring, M.D.  LOCATION:  [facility] [phone]     --------------------------------------------------------------------------------   CC/HPI: Right distal ureteral calculus   Yvette Henderson returns today 2 weeks after her last visit. She has been completely asymptomatic and denies any right-sided flank or abdominal pain, worsening urinary symptoms, hematuria, nausea/vomiting, or fever. She follows up today with a KUB x-ray. She has been reliably straining her urine and has not seen a stone pass.     ALLERGIES: Amoxicillin - Nausea, Vomiting    MEDICATIONS: Tamsulosin Hcl 0.4 mg capsule 1 capsule PO Q HS  Ibuprofen     GU PSH: None     PSH Notes: Wisdom Teeth removal    NON-GU PSH: None   GU PMH: Ureteral calculus - 09/21/2019    NON-GU PMH: Depression    FAMILY HISTORY: Diabetes - Grandmother, Grandfather Heart Disease - Grandfather Hypertension - Grandmother, Grandfather, Mother, Father seizures - Grandmother Strokes - Grandmother   SOCIAL HISTORY: Marital Status: Single Preferred Language: English; Ethnicity: Not Hispanic Or Latino; Race: Black or African American Current Smoking Status: Patient has never smoked.   Tobacco Use Assessment Completed: Used Tobacco in last 30 days? Drinks 5 drinks per month. Social Drinker.  Drinks 1 caffeinated drink per day. Patient's occupation is/was [employer].    REVIEW OF SYSTEMS:    GU Review Female:   Patient denies frequent urination, hard to postpone urination, burning /pain with urination, get up at night to urinate, leakage of urine, stream starts and stops, trouble starting your stream, have to strain to urinate, and currently pregnant.   Gastrointestinal (Lower):   Patient denies diarrhea and constipation.  Gastrointestinal (Upper):   Patient denies nausea and vomiting.  Constitutional:   Patient denies fever, night sweats, weight loss, and fatigue.  Skin:   Patient denies skin rash/ lesion and itching.  Eyes:   Patient denies double vision and blurred vision.  Ears/ Nose/ Throat:   Patient denies sore throat and sinus problems.  Hematologic/Lymphatic:   Patient denies swollen glands and easy bruising.  Cardiovascular:   Patient denies leg swelling and chest pains.  Respiratory:   Patient denies cough and shortness of breath.  Endocrine:   Patient denies excessive thirst.  Musculoskeletal:   Patient denies back pain and joint pain.  Neurological:   Patient denies headaches and dizziness.  Psychologic:   Patient denies depression and anxiety.   VITAL SIGNS:      10/05/2019 10:55 AM  Weight 117 lb / 53.07 kg  Height 66 in / 167.64 cm  BP 111/72 mmHg  Pulse 76 /min  Temperature 98.0 F / 36.6 C  BMI 18.9 kg/m   MULTI-SYSTEM PHYSICAL EXAMINATION:    Constitutional: Well-nourished. No physical deformities. Normally developed. Good grooming.  Respiratory: No labored breathing, no use of accessory muscles.   Cardiovascular: Normal temperature, normal extremity pulses, no swelling, no varicosities.  Gastrointestinal: No mass, no tenderness, no rigidity, non obese abdomen. No CVA tenderness.     PAST DATA REVIEWED:  Source Of History:  Patient  Records Review:   Previous Patient Records  Urine Test Review:   Urinalysis  X-Ray Review: KUB: Reviewed Films.    Notes:  I independently reviewed her KUB x-ray. This does demonstrate a stable 3-4 mm calcification in the vicinity of the distal right ureter consistent with her known stone.   PROCEDURES:         KUB - [id number]  A single view of the abdomen is obtained.      Patient confirmed No Neulasta OnPro Device.            Urinalysis w/Scope -  81001 Dipstick Dipstick Cont'd Micro  Specimen: Voided Bilirubin: Neg WBC/hpf: 6 - 10/hpf  Color: Amber Ketones: 1+ RBC/hpf: 3 - 10/hpf  Appearance: Cloudy Blood: 1+ Bacteria: Moderate (26-50/hpf)  Specific Gravity: >= 1.030 Protein: 2+ Cystals: Amorph Urates  pH: 6.0 Urobilinogen: 1.0 Casts: NS (Not Seen)  Glucose: Neg Nitrites: Neg Trichomonas: Not Present    Leukocyte Esterase: Trace Mucous: Present      Epithelial Cells: 20 - 40/hpf      Yeast: NS (Not Seen)      Sperm: Not Present         Urinalysis w/Scope - 81001 Dipstick Dipstick Cont'd Micro  Color: Amber Bilirubin: Neg mg/dL WBC/hpf: 6 - 77/AJO  Appearance: Slightly Cloudy Ketones: Neg mg/dL RBC/hpf: 3 - 87/OMV  Specific Gravity: 1.030 Blood: 1+ ery/uL Bacteria: Mod (26-50/hpf)  pH: 6.0 Protein: 2+ mg/dL Cystals: Amorph Urates  Glucose: Neg mg/dL Urobilinogen: 1.0 mg/dL Casts: NS (Not Seen)    Nitrites: Neg Trichomonas: Not Present    Leukocyte Esterase: Trace leu/uL Mucous: Present      Epithelial Cells: 20 - 40/hpf      Yeast: NS (Not Seen)      Sperm: Not Present    ASSESSMENT:      ICD-10 Details  1 GU:   Ureteral calculus - N20.1    PLAN:           Orders Labs Urine Culture          Schedule Return Visit/Planned Activity: Other See Visit Notes             Note: Will call to schedule surgery.          Document Letter(s):  Created for Patient: Clinical Summary         Notes:   1. Distal right ureteral calculus: Her stone remains persistently present. We have discussed proceeding with treatment if she is unable to pass her stone. Fortunately, she is not symptomatic at this time in so we will plan to proceed with treatment in the next few weeks but give her additional time to try to pass her stone in the meantime. We reviewed options for treatment including shockwave lithotripsy and ureteroscopic treatment. We reviewed the pros and cons of each of these approaches. Ultimately, I did recommend ureteroscopic  stone removal with possible laser lithotripsy and stent placement if necessary. We reviewed this procedure in detail including the potential risks, complications, and expected recovery process. Her urine will be cultured today although likely represents contamination.   Cc: Rueben Bash, PA        Next Appointment:      Next Appointment: 10/17/2019 04:00 PM    Appointment Type: Surgery     Location: [facility] [phone]    Provider: Heloise Purpura, M.D.    Reason for Visit: WL/OP CYSTO, RT RPG, RT URS STONE REMOVAL POSS LL, POSS RT UR STENT      * Signed by Heloise Purpura, M.D. on 10/05/19 at 4:28 PM (EDT)*

## 2019-10-17 ENCOUNTER — Encounter (HOSPITAL_COMMUNITY)
Admission: RE | Disposition: A | Discharge status: Home / Self Care | Payer: Self-pay | Source: Home / Self Care | Attending: Urology

## 2019-10-17 ENCOUNTER — Encounter (HOSPITAL_COMMUNITY): Payer: Self-pay | Admitting: Urology

## 2019-10-17 ENCOUNTER — Ambulatory Visit (HOSPITAL_COMMUNITY)
Admission: RE | Admit: 2019-10-17 | Discharge: 2019-10-17 | Disposition: A | Discharge status: Home / Self Care | Payer: PRIVATE HEALTH INSURANCE | Attending: Urology | Admitting: Urology

## 2019-10-17 ENCOUNTER — Ambulatory Visit (HOSPITAL_COMMUNITY): Payer: PRIVATE HEALTH INSURANCE | Admitting: Certified Registered"

## 2019-10-17 ENCOUNTER — Ambulatory Visit (HOSPITAL_COMMUNITY): Payer: PRIVATE HEALTH INSURANCE

## 2019-10-17 DIAGNOSIS — N201 Calculus of ureter: Secondary | ICD-10-CM | POA: Insufficient documentation

## 2019-10-17 HISTORY — PX: CYSTOSCOPY/URETEROSCOPY/HOLMIUM LASER/STENT PLACEMENT: SHX6546

## 2019-10-17 SURGERY — CYSTOSCOPY/URETEROSCOPY/HOLMIUM LASER/STENT PLACEMENT
Anesthesia: General | Laterality: Right

## 2019-10-17 MED ORDER — PROPOFOL 10 MG/ML IV BOLUS
INTRAVENOUS | Status: AC
  Filled 2019-10-17: qty 20

## 2019-10-17 MED ORDER — EPHEDRINE 5 MG/ML INJ
INTRAVENOUS | Status: AC
  Filled 2019-10-17: qty 10

## 2019-10-17 MED ORDER — PROPOFOL 10 MG/ML IV BOLUS
INTRAVENOUS | Status: DC | PRN
  Administered 2019-10-17: 150 mg via INTRAVENOUS

## 2019-10-17 MED ORDER — FENTANYL CITRATE (PF) 100 MCG/2ML IJ SOLN
INTRAMUSCULAR | Status: DC | PRN
  Administered 2019-10-17: 100 ug via INTRAVENOUS

## 2019-10-17 MED ORDER — IOHEXOL 300 MG/ML  SOLN
INTRAMUSCULAR | Status: DC | PRN
  Administered 2019-10-17: 17:00:00 10 mL

## 2019-10-17 MED ORDER — ACETAMINOPHEN 10 MG/ML IV SOLN: 1000.0000 mg | Freq: Once | INTRAVENOUS | Status: DC | PRN

## 2019-10-17 MED ORDER — DEXAMETHASONE SODIUM PHOSPHATE 10 MG/ML IJ SOLN
INTRAMUSCULAR | Status: DC | PRN
  Administered 2019-10-17: 10 mg via INTRAVENOUS

## 2019-10-17 MED ORDER — PHENYLEPHRINE 40 MCG/ML (10ML) SYRINGE FOR IV PUSH (FOR BLOOD PRESSURE SUPPORT)
PREFILLED_SYRINGE | INTRAVENOUS | Status: AC
  Filled 2019-10-17: qty 10

## 2019-10-17 MED ORDER — PHENYLEPHRINE 40 MCG/ML (10ML) SYRINGE FOR IV PUSH (FOR BLOOD PRESSURE SUPPORT)
PREFILLED_SYRINGE | INTRAVENOUS | Status: DC | PRN
  Administered 2019-10-17: 120 ug via INTRAVENOUS

## 2019-10-17 MED ORDER — ACETAMINOPHEN 325 MG PO TABS: 325.0000 mg | ORAL_TABLET | Freq: Once | ORAL | Status: DC | PRN

## 2019-10-17 MED ORDER — PROMETHAZINE HCL 25 MG/ML IJ SOLN: 6.2500 mg | INTRAMUSCULAR | Status: DC | PRN

## 2019-10-17 MED ORDER — MIDAZOLAM HCL 2 MG/2ML IJ SOLN
INTRAMUSCULAR | Status: AC
  Filled 2019-10-17: qty 2

## 2019-10-17 MED ORDER — LIDOCAINE 2% (20 MG/ML) 5 ML SYRINGE
INTRAMUSCULAR | Status: DC | PRN
  Administered 2019-10-17: 80 mg via INTRAVENOUS

## 2019-10-17 MED ORDER — DEXAMETHASONE SODIUM PHOSPHATE 10 MG/ML IJ SOLN
INTRAMUSCULAR | Status: AC
  Filled 2019-10-17: qty 1

## 2019-10-17 MED ORDER — SCOPOLAMINE 1 MG/3DAYS TD PT72
MEDICATED_PATCH | TRANSDERMAL | Status: AC
  Filled 2019-10-17: qty 1

## 2019-10-17 MED ORDER — MIDAZOLAM HCL 5 MG/5ML IJ SOLN
INTRAMUSCULAR | Status: DC | PRN
  Administered 2019-10-17: 2 mg via INTRAVENOUS

## 2019-10-17 MED ORDER — FENTANYL CITRATE (PF) 100 MCG/2ML IJ SOLN: 25.0000 ug | INTRAMUSCULAR | Status: DC | PRN

## 2019-10-17 MED ORDER — CEFAZOLIN SODIUM-DEXTROSE 2-4 GM/100ML-% IV SOLN
2.0000 g | Freq: Once | INTRAVENOUS | Status: AC
  Administered 2019-10-17: 2 g via INTRAVENOUS
  Filled 2019-10-17: qty 100

## 2019-10-17 MED ORDER — ONDANSETRON HCL 4 MG/2ML IJ SOLN
INTRAMUSCULAR | Status: DC | PRN
  Administered 2019-10-17: 4 mg via INTRAVENOUS

## 2019-10-17 MED ORDER — LACTATED RINGERS IV SOLN: INTRAVENOUS | Status: DC

## 2019-10-17 MED ORDER — ONDANSETRON HCL 4 MG/2ML IJ SOLN
INTRAMUSCULAR | Status: AC
  Filled 2019-10-17: qty 2

## 2019-10-17 MED ORDER — LIDOCAINE 2% (20 MG/ML) 5 ML SYRINGE
INTRAMUSCULAR | Status: AC
  Filled 2019-10-17: qty 5

## 2019-10-17 MED ORDER — SCOPOLAMINE 1 MG/3DAYS TD PT72
MEDICATED_PATCH | TRANSDERMAL | Status: DC | PRN
  Administered 2019-10-17: 1 via TRANSDERMAL

## 2019-10-17 MED ORDER — ACETAMINOPHEN 160 MG/5ML PO SOLN: 325.0000 mg | Freq: Once | ORAL | Status: DC | PRN

## 2019-10-17 MED ORDER — MEPERIDINE HCL 50 MG/ML IJ SOLN: 6.2500 mg | INTRAMUSCULAR | Status: DC | PRN

## 2019-10-17 MED ORDER — FENTANYL CITRATE (PF) 100 MCG/2ML IJ SOLN
INTRAMUSCULAR | Status: AC
  Filled 2019-10-17: qty 2

## 2019-10-17 MED ORDER — SODIUM CHLORIDE 0.9 % IR SOLN
Status: DC | PRN
  Administered 2019-10-17: 3000 mL

## 2019-10-17 SURGICAL SUPPLY — 20 items
BAG URO CATCHER STRL LF (MISCELLANEOUS) ×3 IMPLANT
BASKET ZERO TIP NITINOL 2.4FR (BASKET) IMPLANT
BSKT STON RTRVL ZERO TP 2.4FR (BASKET)
CATH INTERMIT  6FR 70CM (CATHETERS) ×2 IMPLANT
CLOTH BEACON ORANGE TIMEOUT ST (SAFETY) ×3 IMPLANT
FIBER LASER FLEXIVA 365 (UROLOGICAL SUPPLIES) IMPLANT
FIBER LASER TRAC TIP (UROLOGICAL SUPPLIES) IMPLANT
GLOVE BIOGEL M STRL SZ7.5 (GLOVE) ×3 IMPLANT
GOWN STRL REUS W/TWL LRG LVL3 (GOWN DISPOSABLE) ×6 IMPLANT
GUIDEWIRE ANG ZIPWIRE 038X150 (WIRE) IMPLANT
GUIDEWIRE STR DUAL SENSOR (WIRE) ×3 IMPLANT
IV NS 1000ML (IV SOLUTION) ×3
IV NS 1000ML BAXH (IV SOLUTION) ×1 IMPLANT
KIT TURNOVER KIT A (KITS) IMPLANT
MANIFOLD NEPTUNE II (INSTRUMENTS) ×3 IMPLANT
PACK CYSTO (CUSTOM PROCEDURE TRAY) ×3 IMPLANT
SHEATH URETERAL 12FRX35CM (MISCELLANEOUS) IMPLANT
TUBING CONNECTING 10 (TUBING) ×2 IMPLANT
TUBING CONNECTING 10' (TUBING) ×1
TUBING UROLOGY SET (TUBING) ×3 IMPLANT

## 2019-10-17 NOTE — Transfer of Care (Signed)
Immediate Anesthesia Transfer of Care Note  Patient: Yvette Henderson  Procedure(s) Performed: CYSTOSCOPY, RETROGRADE PYELOGRAM, URETEROSCOPY (Right )  Patient Location: PACU  Anesthesia Type:General  Level of Consciousness: awake and alert   Airway & Oxygen Therapy: Patient Spontanous Breathing and Patient connected to face mask oxygen  Post-op Assessment: Report given to RN and Post -op Vital signs reviewed and stable  Post vital signs: Reviewed and stable  Last Vitals:  Vitals Value Taken Time  BP 112/69 10/17/19 1724  Temp    Pulse 90 10/17/19 1725  Resp 14 10/17/19 1725  SpO2 100 % 10/17/19 1725  Vitals shown include unvalidated device data.  Last Pain:  Vitals:   10/17/19 1432  TempSrc:   PainSc: 0-No pain         Complications: No apparent anesthesia complications

## 2019-10-17 NOTE — Discharge Instructions (Signed)
1. You may see some blood in the urine and may have some burning with urination for 48-72 hours. You also may notice that you have to urinate more frequently or urgently after your procedure which is normal.  °2. You should call should you develop an inability urinate, fever > 101, persistent nausea and vomiting that prevents you from eating or drinking to stay hydrated.  °

## 2019-10-17 NOTE — Anesthesia Postprocedure Evaluation (Signed)
Anesthesia Post Note  Patient: Yvette Henderson  Procedure(s) Performed: CYSTOSCOPY, RETROGRADE PYELOGRAM, URETEROSCOPY (Right )     Patient location during evaluation: PACU Anesthesia Type: General Level of consciousness: awake and alert Pain management: pain level controlled Vital Signs Assessment: post-procedure vital signs reviewed and stable Respiratory status: spontaneous breathing, nonlabored ventilation, respiratory function stable and patient connected to nasal cannula oxygen Cardiovascular status: blood pressure returned to baseline and stable Postop Assessment: no apparent nausea or vomiting Anesthetic complications: no    Last Vitals:  Vitals:   10/17/19 1724 10/17/19 1730  BP: 112/69 111/68  Pulse: 94 85  Resp: 17 16  Temp: 36.8 C   SpO2: 100% 100%    Last Pain:  Vitals:   10/17/19 1730  TempSrc:   PainSc: 0-No pain                 Samani Deal S

## 2019-10-17 NOTE — Anesthesia Procedure Notes (Signed)
Procedure Name: LMA Insertion Date/Time: 10/17/2019 4:46 PM Performed by: Minerva Ends, CRNA Pre-anesthesia Checklist: Patient identified, Emergency Drugs available, Suction available and Patient being monitored Patient Re-evaluated:Patient Re-evaluated prior to induction Oxygen Delivery Method: Circle System Utilized Preoxygenation: Pre-oxygenation with 100% oxygen Induction Type: IV induction Ventilation: Mask ventilation without difficulty LMA: LMA inserted LMA Size: 4.0 Number of attempts: 1 Airway Equipment and Method: Bite block Placement Confirmation: positive ETCO2 Tube secured with: Tape Dental Injury: Teeth and Oropharynx as per pre-operative assessment  Comments: By Grayling Congress CRNA

## 2019-10-17 NOTE — Anesthesia Preprocedure Evaluation (Addendum)
Anesthesia Evaluation  Patient identified by MRN, date of birth, ID band Patient awake    Reviewed: Allergy & Precautions, NPO status , Patient's Chart, lab work & pertinent test results  Airway Mallampati: I  TM Distance: >3 FB Neck ROM: Full    Dental  (+) Teeth Intact, Dental Advisory Given   Pulmonary    breath sounds clear to auscultation       Cardiovascular negative cardio ROS   Rhythm:Regular Rate:Normal     Neuro/Psych PSYCHIATRIC DISORDERS Anxiety Depression negative neurological ROS     GI/Hepatic negative GI ROS, Neg liver ROS,   Endo/Other  negative endocrine ROS  Renal/GU negative Renal ROS     Musculoskeletal negative musculoskeletal ROS (+)   Abdominal Normal abdominal exam  (+)   Peds  Hematology negative hematology ROS (+)   Anesthesia Other Findings   Reproductive/Obstetrics                            Anesthesia Physical Anesthesia Plan  ASA: II  Anesthesia Plan: General   Post-op Pain Management:    Induction: Intravenous  PONV Risk Score and Plan: 4 or greater and Ondansetron, Dexamethasone, Midazolam and Scopolamine patch - Pre-op  Airway Management Planned: LMA  Additional Equipment: None  Intra-op Plan:   Post-operative Plan: Extubation in OR  Informed Consent: I have reviewed the patients History and Physical, chart, labs and discussed the procedure including the risks, benefits and alternatives for the proposed anesthesia with the patient or authorized representative who has indicated his/her understanding and acceptance.     Dental advisory given  Plan Discussed with: CRNA  Anesthesia Plan Comments:        Anesthesia Quick Evaluation

## 2019-10-17 NOTE — Interval H&P Note (Signed)
History and Physical Interval Note:  10/17/2019 3:52 PM  Yvette Henderson  has presented today for surgery, with the diagnosis of RIGHT URETERAL CALCULUS.  The various methods of treatment have been discussed with the patient and family. After consideration of risks, benefits and other options for treatment, the patient has consented to  Procedure(s) with comments: CYSTOSCOPY/RETROGRADE/URETEROSCOPIC STONE REMOVAL/POSSIBLE HOLMIUM LASER/ POSSIBLE STENT PLACEMENT (Right) - ONLY NEEDS 45 MIN as a surgical intervention.  The patient's history has been reviewed, patient examined, no change in status, stable for surgery.  I have reviewed the patient's chart and labs.  Questions were answered to the patient's satisfaction.     [employer]

## 2019-10-17 NOTE — Anesthesia Procedure Notes (Signed)
Date/Time: 10/17/2019 5:15 PM Performed by: Minerva Ends, CRNA Oxygen Delivery Method: Simple face mask Placement Confirmation: breath sounds checked- equal and bilateral and positive ETCO2 Dental Injury: Teeth and Oropharynx as per pre-operative assessment

## 2019-10-17 NOTE — Op Note (Signed)
Preoperative diagnosis: Right distal ureteral calculus  Postoperative diagnosis: History of right ureteral calculus  Procedure:  1. Cystoscopy 2. Right ureteroscopy  3. Right retrograde pyelography with interpretation  Surgeon: Moody Bruins. M.D.  Anesthesia: General  Complications: None  Intraoperative findings: Right retrograde pyelography demonstrated no filling defects within the ureter or renal pelvis.  EBL: Minimal  Indication: Yvette Henderson is a 24 y.o. year old patient with urolithiasis. After reviewing the management options for treatment, the patient elected to proceed with the above surgical procedure(s). We have discussed the potential benefits and risks of the procedure, side effects of the proposed treatment, the likelihood of the patient achieving the goals of the procedure, and any potential problems that might occur during the procedure or recuperation. Informed consent has been obtained.  Description of procedure:  The patient was taken to the operating room and general anesthesia was induced.  The patient was placed in the dorsal lithotomy position, prepped and draped in the usual sterile fashion, and preoperative antibiotics were administered. A preoperative time-out was performed.   Cystourethroscopy was performed.  The patient's urethra was examined and was normal. The bladder was then systematically examined in its entirety. There was no evidence for any bladder tumors, stones, or other mucosal pathology.    Attention then turned to the right ureteral orifice and a ureteral catheter was used to intubate the ureteral orifice.  Omnipaque contrast was injected through the ureteral catheter and a retrograde pyelogram was performed with findings as dictated above.  A 0.38 sensor guidewire was then advanced up the right ureter into the renal pelvis under fluoroscopic guidance. The 6 Fr semirigid ureteroscope was then advanced into the ureter next to the guidewire.   No ureteral calculus was identified and the ureter was examined all the way up to the level of the UPJ.  The bladder was then emptied and the procedure ended.  The patient appeared to tolerate the procedure well and without complications.  The patient was able to be awakened and transferred to the recovery unit in satisfactory condition.

## 2019-10-17 NOTE — Progress Notes (Signed)
Pt discharged in NAD, VSS, no pain. Pt voided upon discharge. Pt and mother given discharge instructions. Pt discharged home with mom.

## 2020-01-12 DIAGNOSIS — N760 Acute vaginitis: Secondary | ICD-10-CM | POA: Diagnosis not present

## 2020-01-12 DIAGNOSIS — B373 Candidiasis of vulva and vagina: Secondary | ICD-10-CM | POA: Diagnosis not present

## 2020-02-23 DIAGNOSIS — N760 Acute vaginitis: Secondary | ICD-10-CM | POA: Diagnosis not present

## 2020-02-23 DIAGNOSIS — R895 Abnormal microbiological findings in specimens from other organs, systems and tissues: Secondary | ICD-10-CM | POA: Diagnosis not present

## 2020-02-23 DIAGNOSIS — Z113 Encounter for screening for infections with a predominantly sexual mode of transmission: Secondary | ICD-10-CM | POA: Diagnosis not present

## 2020-04-30 DIAGNOSIS — R300 Dysuria: Secondary | ICD-10-CM | POA: Diagnosis not present

## 2020-04-30 DIAGNOSIS — N390 Urinary tract infection, site not specified: Secondary | ICD-10-CM | POA: Diagnosis not present

## 2021-01-04 ENCOUNTER — Other Ambulatory Visit (HOSPITAL_COMMUNITY): Payer: Self-pay

## 2021-01-04 DIAGNOSIS — Z609 Problem related to social environment, unspecified: Secondary | ICD-10-CM | POA: Diagnosis not present

## 2021-01-04 DIAGNOSIS — D649 Anemia, unspecified: Secondary | ICD-10-CM | POA: Diagnosis not present

## 2021-01-04 DIAGNOSIS — Z01419 Encounter for gynecological examination (general) (routine) without abnormal findings: Secondary | ICD-10-CM | POA: Diagnosis not present

## 2021-01-04 DIAGNOSIS — Z113 Encounter for screening for infections with a predominantly sexual mode of transmission: Secondary | ICD-10-CM | POA: Diagnosis not present

## 2021-01-04 DIAGNOSIS — Z681 Body mass index (BMI) 19 or less, adult: Secondary | ICD-10-CM | POA: Diagnosis not present

## 2021-01-07 ENCOUNTER — Other Ambulatory Visit (HOSPITAL_COMMUNITY): Payer: Self-pay

## 2021-01-07 MED ORDER — VALACYCLOVIR HCL 1 G PO TABS
1000.0000 mg | ORAL_TABLET | Freq: Every day | ORAL | 3 refills | Status: DC
  Filled 2021-01-07: qty 90, 90d supply, fill #0

## 2021-01-15 ENCOUNTER — Other Ambulatory Visit (HOSPITAL_COMMUNITY): Payer: Self-pay

## 2021-02-05 DIAGNOSIS — G44319 Acute post-traumatic headache, not intractable: Secondary | ICD-10-CM | POA: Diagnosis not present

## 2021-02-05 DIAGNOSIS — H832X9 Labyrinthine dysfunction, unspecified ear: Secondary | ICD-10-CM | POA: Diagnosis not present

## 2021-02-05 DIAGNOSIS — S060X0A Concussion without loss of consciousness, initial encounter: Secondary | ICD-10-CM | POA: Diagnosis not present

## 2021-04-01 IMAGING — US US PELVIS COMPLETE TRANSABD/TRANSVAG W DUPLEX
1 series · 13 of 25 positions shown · non-contrast
Comparison: 04/07/2014

CLINICAL DATA: Right lower quadrant pain since last 9

EXAM:
TRANSABDOMINAL AND TRANSVAGINAL ULTRASOUND OF PELVIS
DOPPLER ULTRASOUND OF OVARIES
TECHNIQUE: Both transabdominal and transvaginal ultrasound examinations of the
pelvis were performed. Transabdominal technique was performed for
global imaging of the pelvis including uterus, ovaries, adnexal
regions, and pelvic cul-de-sac.
It was necessary to proceed with endovaginal exam following the
transabdominal exam to visualize the left and right ovaries. Color
and duplex Doppler ultrasound was utilized to evaluate blood flow to
the ovaries.

[Series 1: us pelvis complete transabd/transvag w duplex · 13 of 98 slices shown]
[im 1/98]
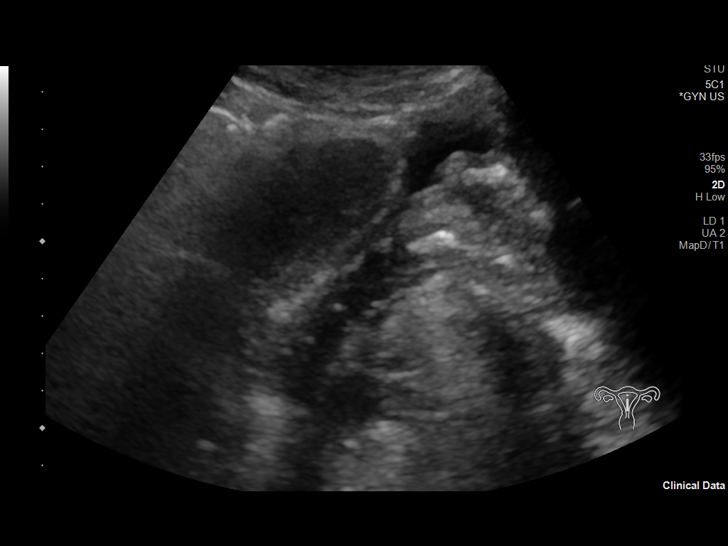
[im 9/98]
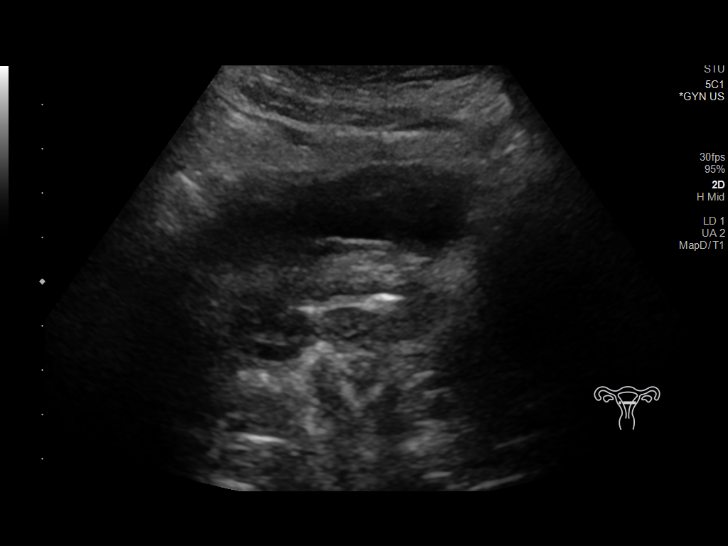
[im 17/98]
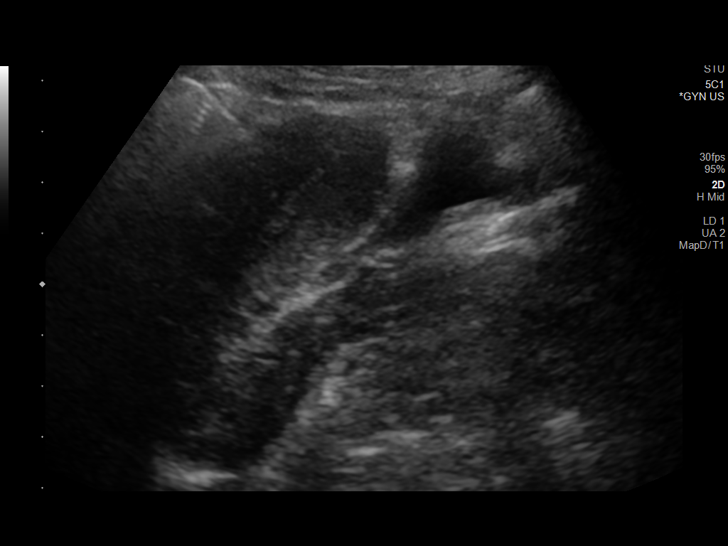
[im 25/98]
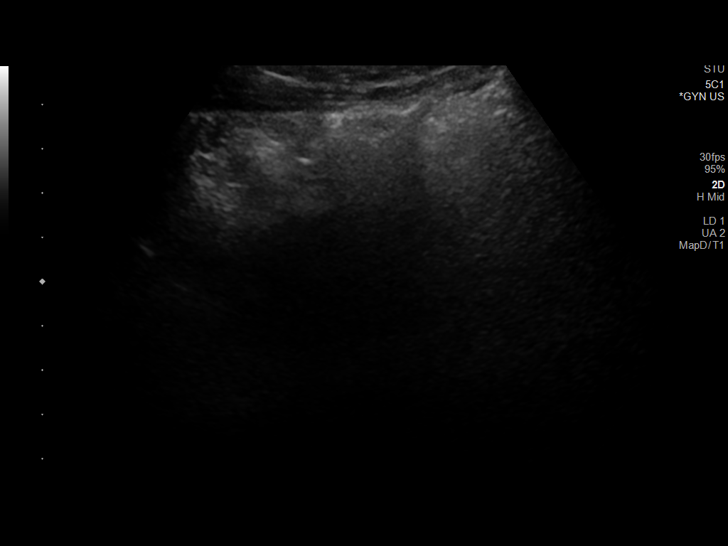
[im 33/98]
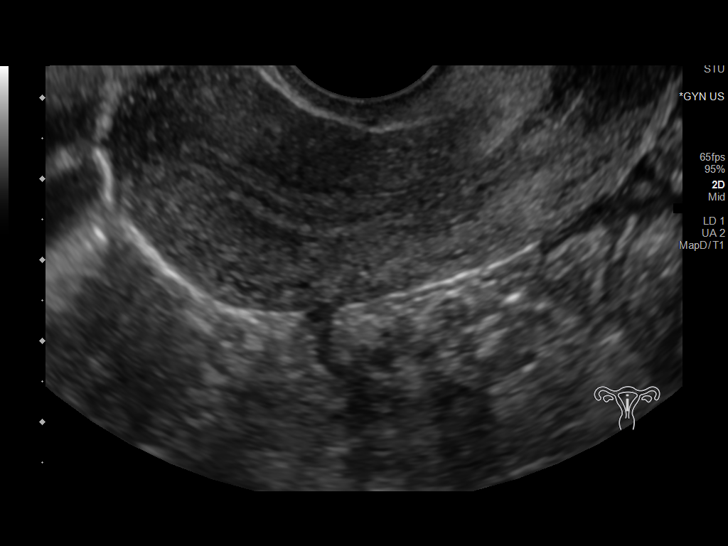
[im 41/98]
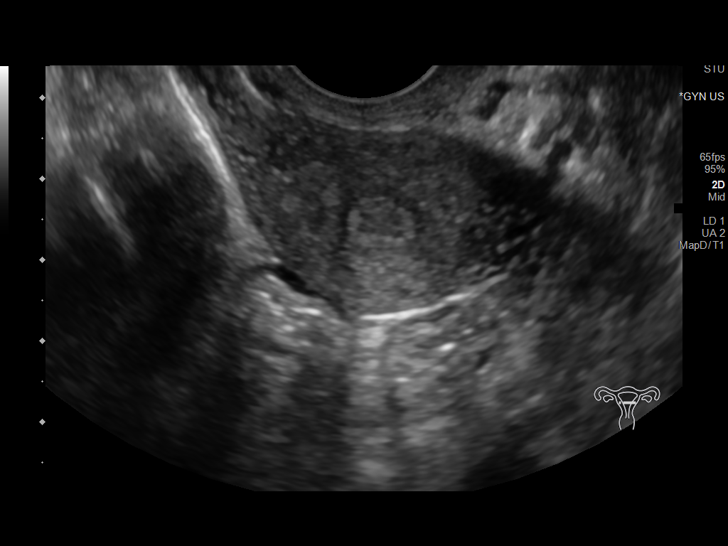
[im 49/98]
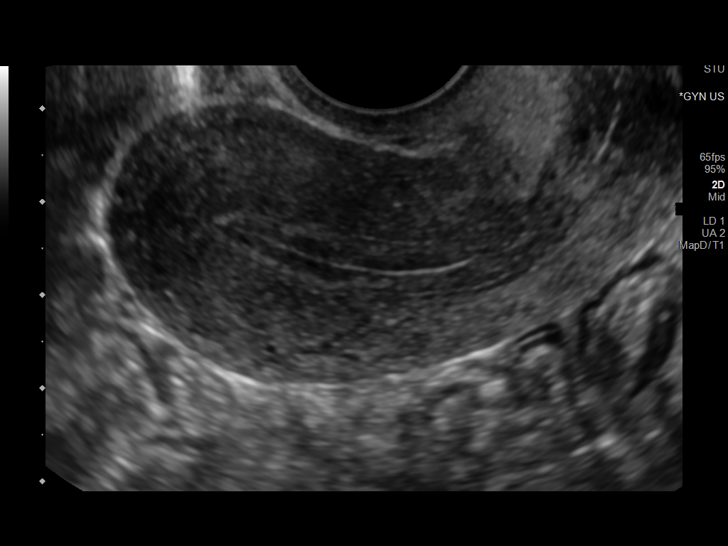
[im 57/98]
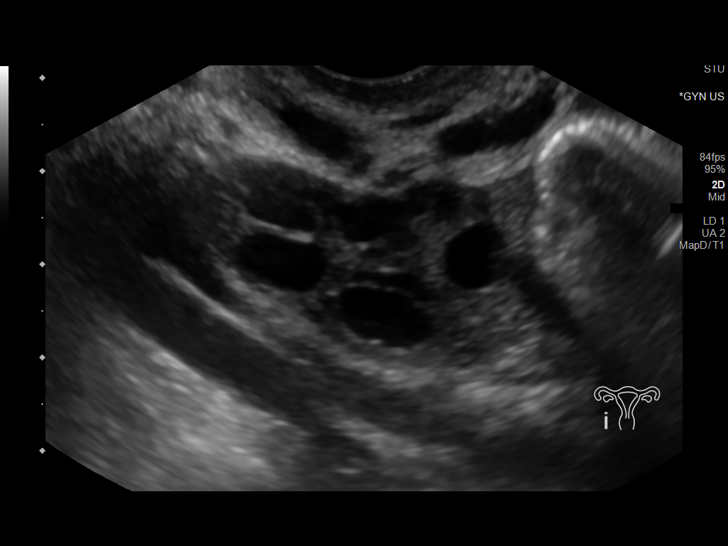
[im 65/98]
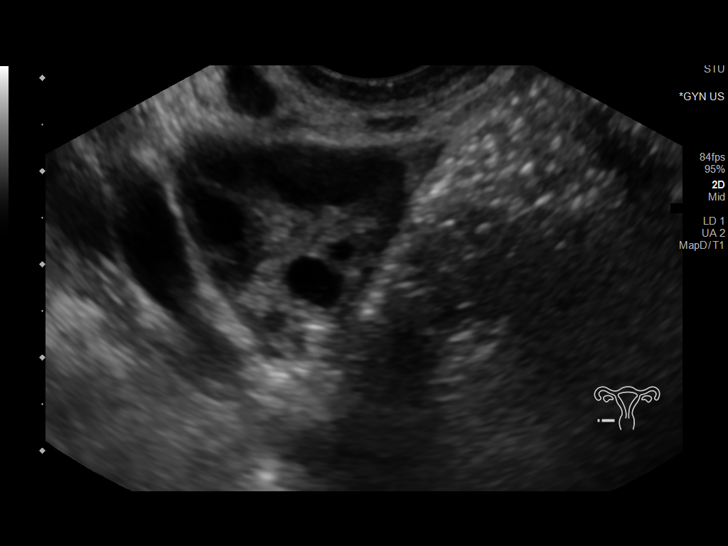
[im 73/98]
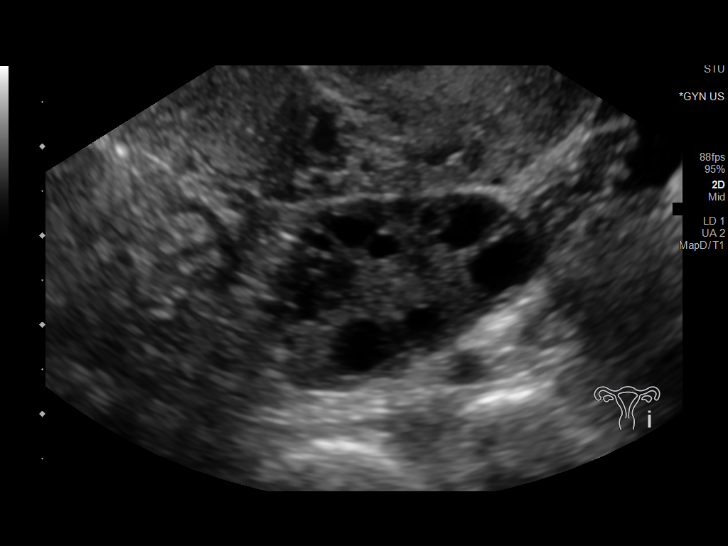
[im 81/98]
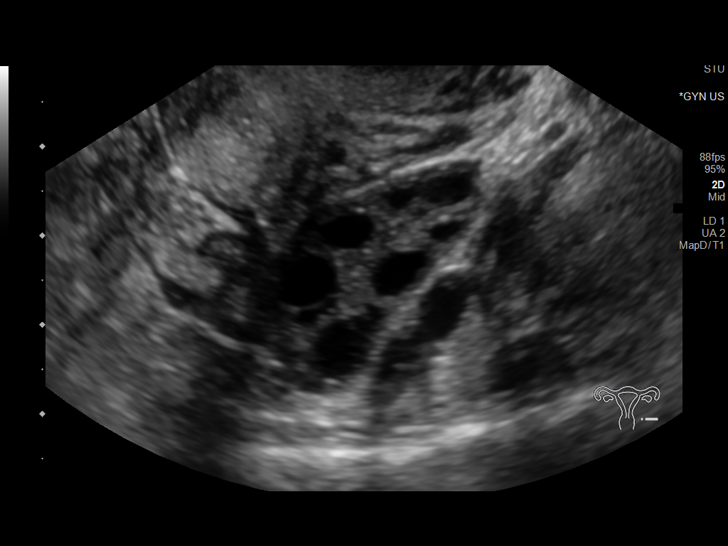
[im 89/98]
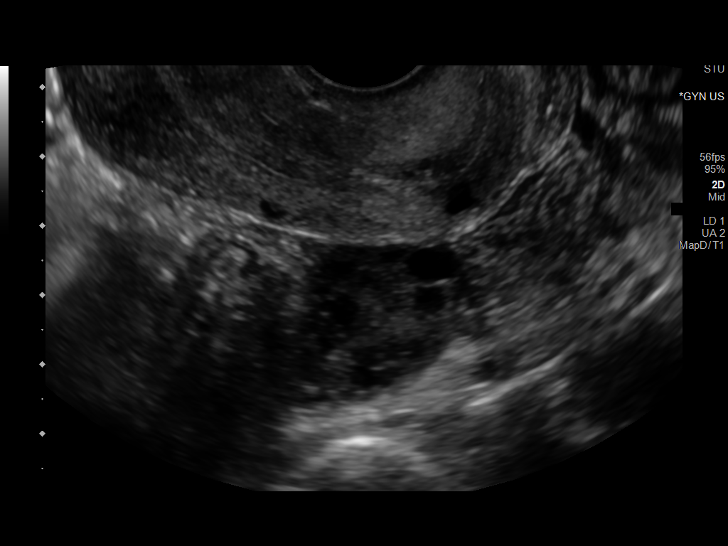
[im 98/98]
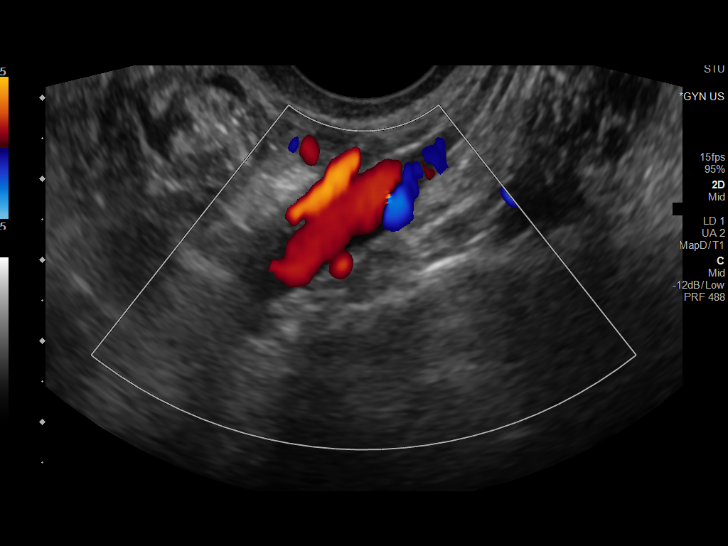

[13 of 25 positions shown; findings below may reference images not displayed]

FINDINGS: Uterus

Measurements: 6.4 x 2.9 x 3.8 cm = volume: 37 mL. No fibroids or
other mass visualized.

Endometrium

Thickness: 6 mm.  No focal abnormality visualized.

Right ovary

Measurements: 4.1 x 1.8 x 2.4 cm = volume: 9 mL. Multiple small
follicles. Normal appearance/no adnexal mass.

Left ovary

Measurements: 3.6 x 2.1 x 2.6 cm = volume: 9 mL. Multiple small
follicles. Normal appearance/no adnexal mass.

Pulsed Doppler evaluation of both ovaries demonstrates normal
low-resistance arterial and venous waveforms.

Other findings

No abnormal free fluid.
IMPRESSION: No ultrasound abnormality of the pelvis to explain right lower
quadrant pain. Consider CT or MRI to further evaluate otherwise
unexplained abdominal pain.

## 2021-04-01 IMAGING — CT CT ABD-PELV W/ CM
2 of 4 series · 16 of 46 positions shown, 18 images · IV contrast (omnipaque)
Comparison: Pelvic ultrasound 09/15/2019

CLINICAL DATA: Right lower quadrant abdominal pain.

EXAM:
CT ABDOMEN AND PELVIS WITH CONTRAST
TECHNIQUE: Multidetector CT imaging of the abdomen and pelvis was performed
using the standard protocol following bolus administration of
intravenous contrast.
CONTRAST:  100mL OMNIPAQUE IOHEXOL 300 MG/ML  SOLN

[Series 3: a/p w/ 5mm · axial · 0.67mm/px · z∈[+660,+1090]mm · 13 of 94 slices shown, 15 images]
[im 4/94  soft-tissue]
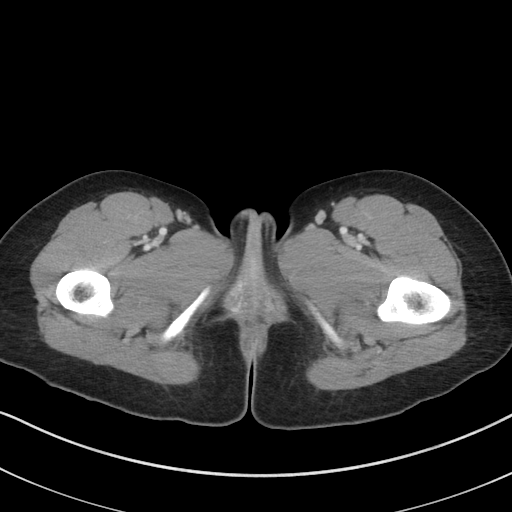
[im 4/94  bone]
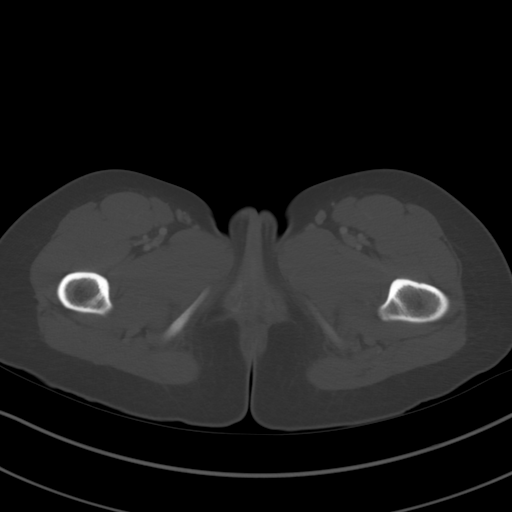
[im 12/94  soft-tissue]
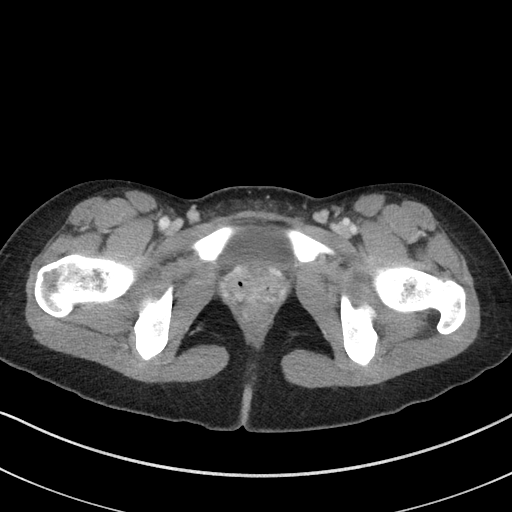
[im 19/94  soft-tissue]
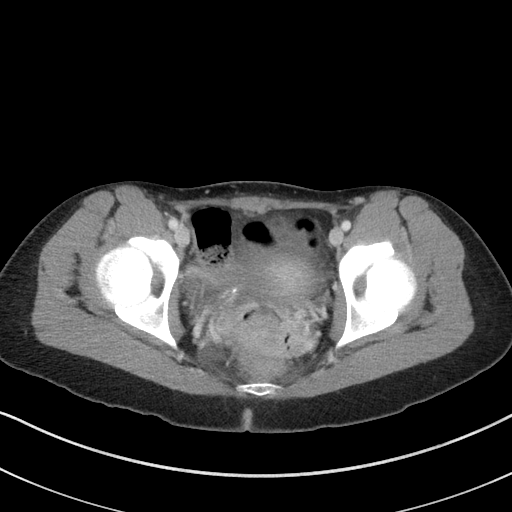
[im 27/94  soft-tissue]
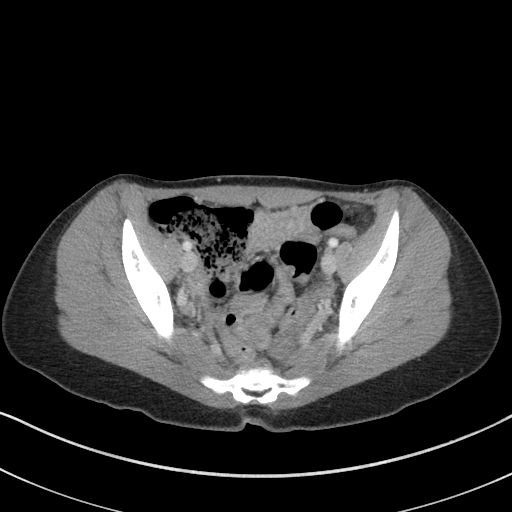
[im 34/94  soft-tissue]
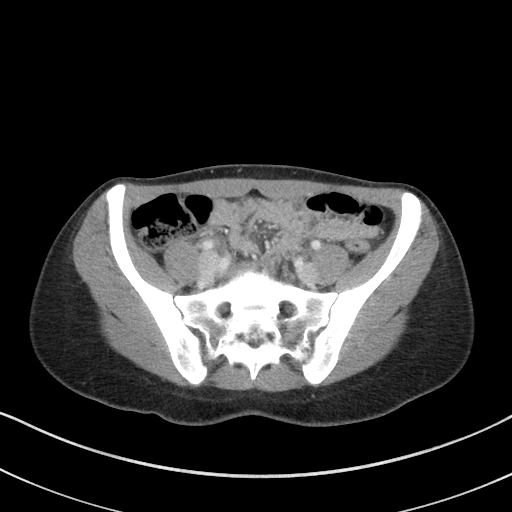
[im 41/94  soft-tissue]
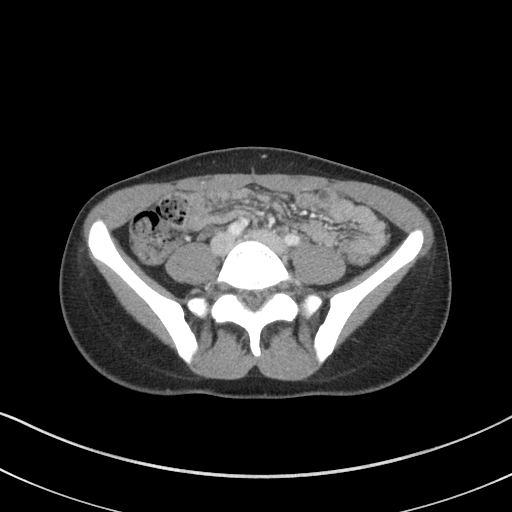
[im 49/94  soft-tissue]
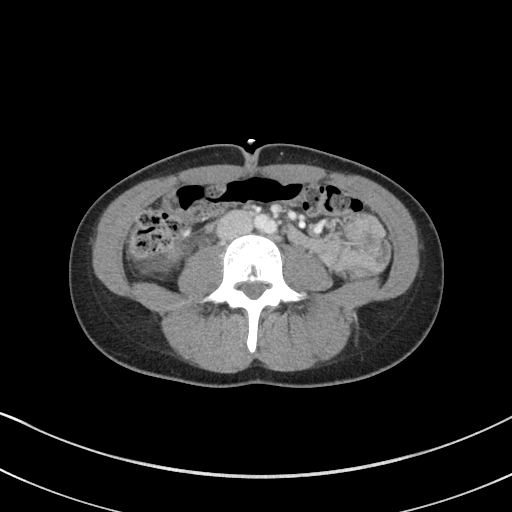
[im 53/94  soft-tissue]
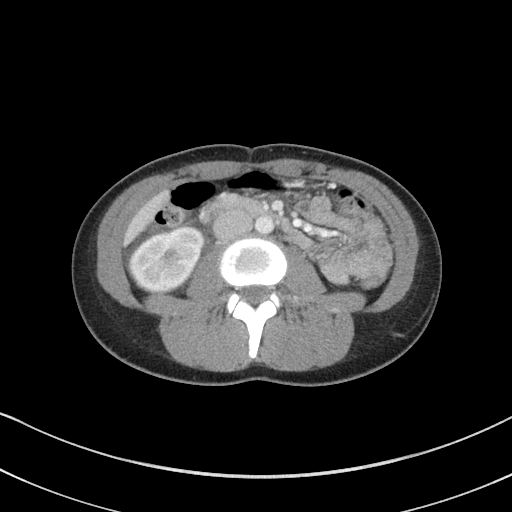
[im 60/94  soft-tissue]
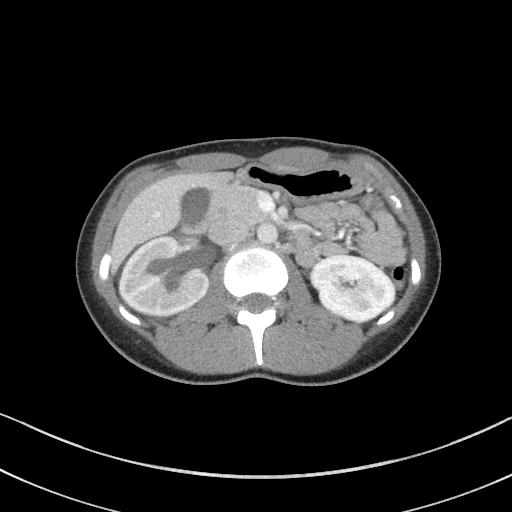
[im 60/94  bone]
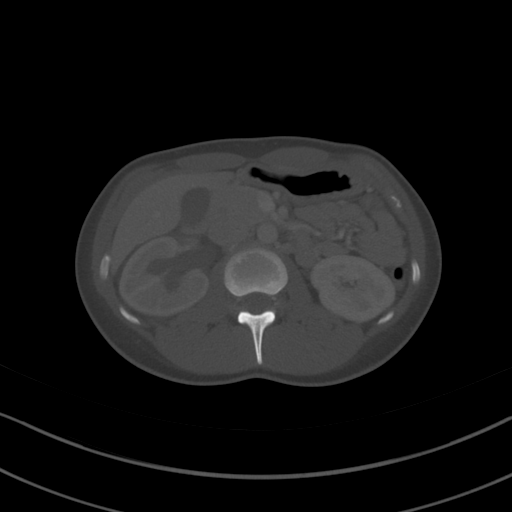
[im 67/94  soft-tissue]
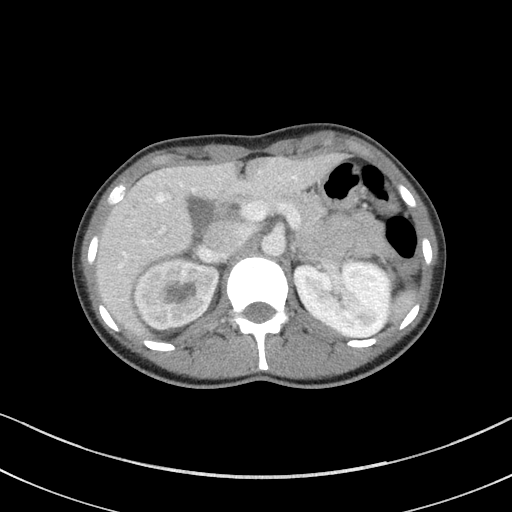
[im 75/94  soft-tissue]
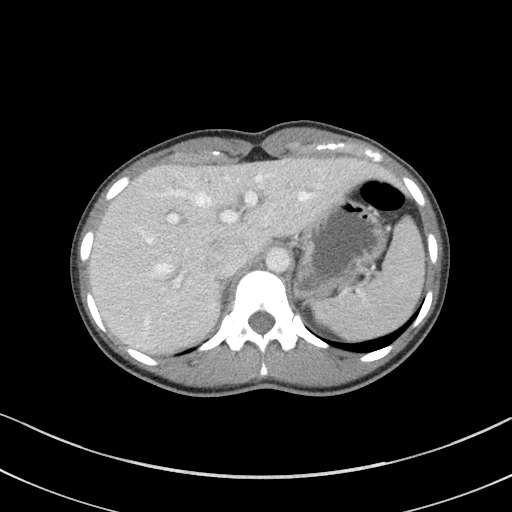
[im 82/94  soft-tissue]
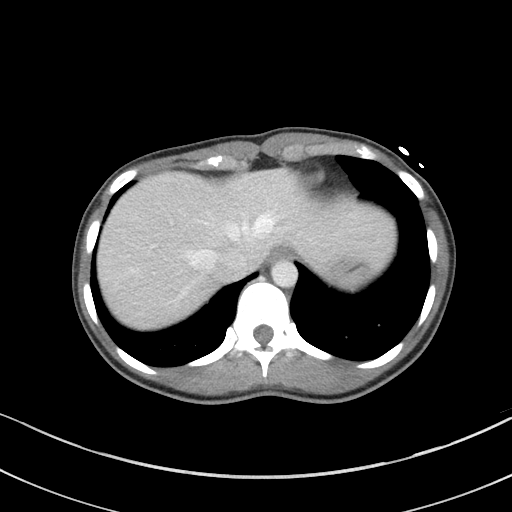
[im 90/94  soft-tissue]
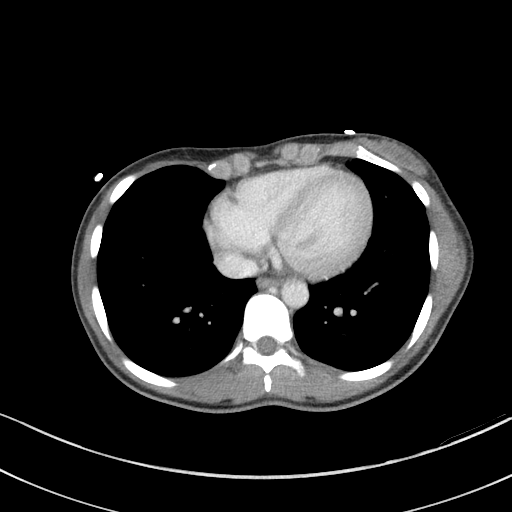

[Series 6: a/p w/ cor · coronal · 0.67mm/px · 3 of 109 slices shown]
[im 37/109  soft-tissue]
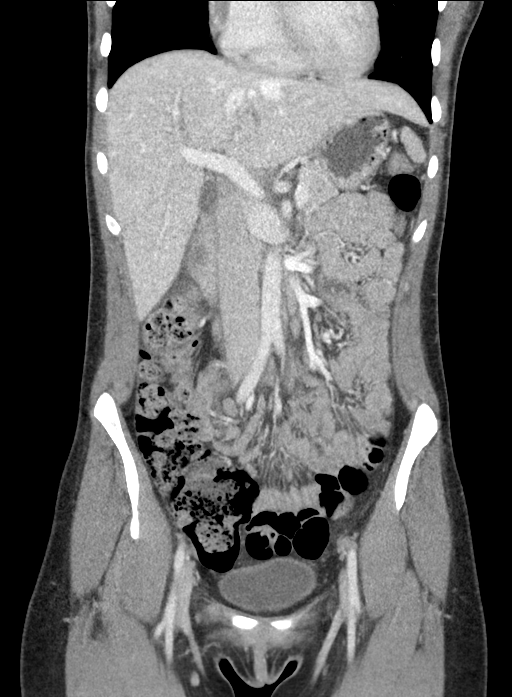
[im 49/109  soft-tissue]
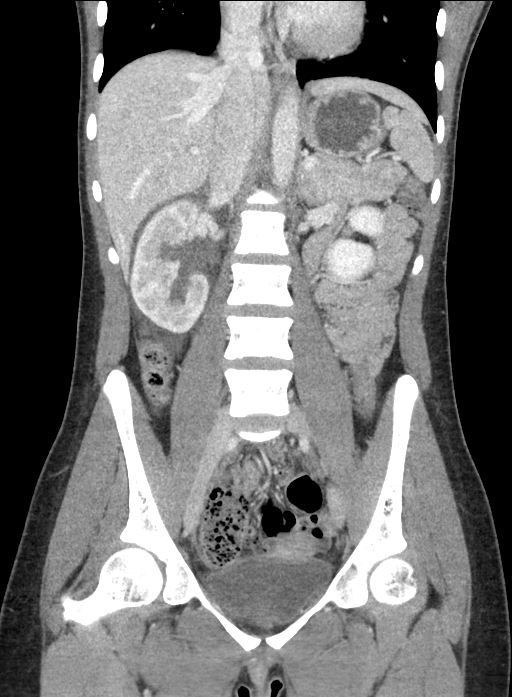
[im 61/109  soft-tissue]
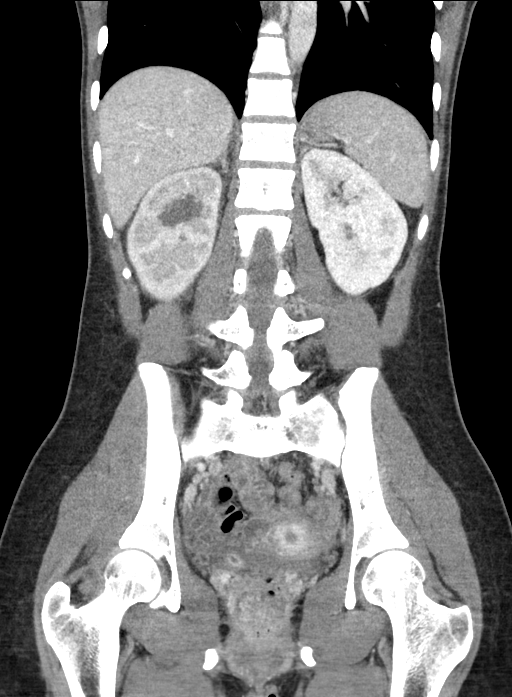

[16 of 46 positions shown; findings below may reference images not displayed]

FINDINGS: Lower chest: Incidental imaging of the lung bases is normal.

Hepatobiliary: Mild periportal edema. Patent portal vein. Patent
patent veins. No pericholecystic stranding. No biliary ductal
dilation.

Pancreas: Pancreas is normal. No signs of inflammation or ductal
dilation.

Spleen: Spleen is normal size.

Adrenals/Urinary Tract: Adrenal glands are normal.

Moderate right hydroureteronephrosis with 3 mm calculus in the
distal ureter at the UVJ on the right. Delayed enhancement of the
right kidney relative to the left. Nonfocal perinephric stranding
and fluid on the right, also Peri ureteral stranding.

No evidence of left-sided hydronephrosis. Urinary bladder is normal.

Stomach/Bowel: No signs of bowel obstruction. Assessment of bowel
loops is limited due to limited intra-abdominal fat. Stool in the
ascending colon. Cecum in the pelvis. Appendix not visualized also
with limited assessment due to paucity of intra-abdominal fat. No
secondary signs to suggest acute appendicitis.

Vascular/Lymphatic: Vascular structures in the abdomen are patent.
No signs of aneurysm or significant atherosclerosis. No adenopathy
in the upper abdomen or retroperitoneum.

No adenopathy in the pelvis.

Reproductive: CT appearance of uterus and adnexa is unremarkable.

Other: Small free fluid in the pelvis, nonspecific.

Musculoskeletal: No acute bone finding or evidence of destructive
bone process.
IMPRESSION: Moderate right hydroureteronephrosis with 3 mm calculus in the
distal ureter at the UVJ on the right, obstruction associated with
delayed enhancement of the right kidney relative to the left.

## 2021-11-27 DIAGNOSIS — Z87442 Personal history of urinary calculi: Secondary | ICD-10-CM | POA: Diagnosis not present

## 2021-11-27 DIAGNOSIS — R1084 Generalized abdominal pain: Secondary | ICD-10-CM | POA: Diagnosis not present

## 2021-11-27 DIAGNOSIS — R1031 Right lower quadrant pain: Secondary | ICD-10-CM | POA: Diagnosis not present

## 2021-12-27 DIAGNOSIS — N760 Acute vaginitis: Secondary | ICD-10-CM | POA: Diagnosis not present

## 2021-12-27 DIAGNOSIS — N926 Irregular menstruation, unspecified: Secondary | ICD-10-CM | POA: Diagnosis not present

## 2021-12-27 DIAGNOSIS — Z113 Encounter for screening for infections with a predominantly sexual mode of transmission: Secondary | ICD-10-CM | POA: Diagnosis not present

## 2022-03-10 DIAGNOSIS — Z113 Encounter for screening for infections with a predominantly sexual mode of transmission: Secondary | ICD-10-CM | POA: Diagnosis not present

## 2022-03-10 DIAGNOSIS — Z01419 Encounter for gynecological examination (general) (routine) without abnormal findings: Secondary | ICD-10-CM | POA: Diagnosis not present

## 2022-03-10 DIAGNOSIS — N760 Acute vaginitis: Secondary | ICD-10-CM | POA: Diagnosis not present

## 2022-03-10 DIAGNOSIS — Z681 Body mass index (BMI) 19 or less, adult: Secondary | ICD-10-CM | POA: Diagnosis not present

## 2022-10-09 DIAGNOSIS — A599 Trichomoniasis, unspecified: Secondary | ICD-10-CM | POA: Diagnosis not present

## 2022-10-09 DIAGNOSIS — N898 Other specified noninflammatory disorders of vagina: Secondary | ICD-10-CM | POA: Diagnosis not present

## 2022-10-09 DIAGNOSIS — Z113 Encounter for screening for infections with a predominantly sexual mode of transmission: Secondary | ICD-10-CM | POA: Diagnosis not present

## 2022-10-09 DIAGNOSIS — N760 Acute vaginitis: Secondary | ICD-10-CM | POA: Diagnosis not present

## 2022-12-02 ENCOUNTER — Ambulatory Visit
Admission: RE | Admit: 2022-12-02 | Discharge: 2022-12-02 | Disposition: A | Discharge status: Home / Self Care | Payer: Commercial Managed Care - PPO | Source: Ambulatory Visit | Attending: Nurse Practitioner | Admitting: Nurse Practitioner

## 2022-12-02 VITALS — BP 115/82 | HR 96 | Temp 98.5°F | Resp 18

## 2022-12-02 DIAGNOSIS — J029 Acute pharyngitis, unspecified: Secondary | ICD-10-CM

## 2022-12-02 LAB — POCT RAPID STREP A (OFFICE): Rapid Strep A Screen: NEGATIVE

## 2022-12-02 NOTE — ED Provider Notes (Signed)
[facility]    CSN: [id number] Arrival date & time: 12/02/22  1344      History   Chief Complaint Chief Complaint  Patient presents with   Sore Throat    Sore throat and fever - Entered by patient    HPI Yvette Henderson is a 27 y.o. female.   Subjective:   Yvette Henderson is a 27 y.o. female who presents for evaluation of a sore throat. Associated symptoms include fevers up to 99 degrees, chills, dry cough, headache, myalgias, and nasal congestion. Onset of symptoms was 4 days ago and has been unchanged since that time.  She is drinking plenty of fluids. She has not had recent close exposure to someone with proven streptococcal pharyngitis. She has not tried anything for her symptoms.   The following portions of the patient's history were reviewed and updated as appropriate: allergies, current medications, past family history, past medical history, past social history, past surgical history, and problem list.       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      Home Medications    Prior to Admission medications   Medication Sig Start Date End Date Taking? Authorizing Provider  ibuprofen (ADVIL) 200 MG tablet Take 400 mg by mouth every 8 (eight) hours as  needed for mild pain.     [provider]    Family History History reviewed. No pertinent family history.  Social History Social History   Tobacco Use   Smoking status: Never   Smokeless tobacco: Never  Vaping Use   Vaping Use: Never used  Substance Use Topics   Alcohol use: Yes    Comment: occasional dark liquor    Drug use: No     Allergies   Amoxicillin   Review of Systems Review of Systems  Constitutional:  Positive for fever.  HENT:  Positive for congestion and sore throat.   Respiratory:  Positive for cough.   Gastrointestinal:  Negative for diarrhea, nausea and vomiting.  Musculoskeletal:  Positive for myalgias.  Neurological:  Positive for headaches.  All other systems reviewed and are negative.    Physical Exam Triage Vital Signs ED Triage Vitals [12/02/22 1404]  Enc Vitals Group     BP 115/82     Pulse Rate 96     Resp 18     Temp 98.5 F (36.9 C)     Temp Source Oral     SpO2 96 %     Weight      Height      Head Circumference      Peak Flow      Pain Score      Pain Loc      Pain Edu?  Excl. in GC?    No data found.  Updated Vital Signs BP 115/82 (BP Location: Left Arm)   Pulse 96   Temp 98.5 F (36.9 C) (Oral)   Resp 18   SpO2 96%   Visual Acuity Right Eye Distance:   Left Eye Distance:   Bilateral Distance:    Right Eye Near:   Left Eye Near:    Bilateral Near:     Physical Exam Vitals reviewed.  Constitutional:      General: She is not in acute distress.    Appearance: She is well-developed and normal weight. She is not ill-appearing or toxic-appearing.  HENT:     Head: Normocephalic.     Right Ear: Tympanic membrane and ear canal normal. No drainage, swelling or tenderness. Tympanic membrane is not erythematous.     Left Ear: Tympanic membrane and ear canal normal. No drainage, swelling or tenderness. Tympanic membrane is not erythematous.     Nose: No congestion.     Mouth/Throat:     Mouth: Mucous  membranes are moist.     Pharynx: Oropharynx is clear. No pharyngeal swelling, oropharyngeal exudate, posterior oropharyngeal erythema or uvula swelling.     Tonsils: No tonsillar exudate or tonsillar abscesses.  Eyes:     Conjunctiva/sclera: Conjunctivae normal.  Cardiovascular:     Rate and Rhythm: Normal rate.     Heart sounds: Normal heart sounds.  Pulmonary:     Effort: Pulmonary effort is normal.     Breath sounds: Normal breath sounds.  Musculoskeletal:     Cervical back: Normal range of motion and neck supple.  Lymphadenopathy:     Cervical: No cervical adenopathy.  Skin:    General: Skin is warm and dry.  Neurological:     General: No focal deficit present.     Mental Status: She is alert and oriented to person, place, and time.      UC Treatments / Results  Labs (all labs ordered are listed, but only abnormal results are displayed) Labs Reviewed  CULTURE, GROUP A STREP [facility])  POCT RAPID STREP A (OFFICE)    EKG   Radiology No results found.  Procedures Procedures (including critical care time)  Medications Ordered in UC Medications - No data to display  Initial Impression / Assessment and Plan / UC Course  I have reviewed the triage vital signs and the nursing notes.  Pertinent labs & imaging results that were available during my care of the patient were reviewed by me and considered in my medical decision making (see chart for details).    27 yo female presenting with sore throat, low grade fevers, chills, dry cough, headache, myalgias and nasal congestion.  Patient is afebrile.  Not stated.  Physical exam as above.  Rapid strep negative.  Symptoms likely due to a viral illness.  Supportive care advised.  Follow-up as needed.  Today's evaluation has revealed no signs of a dangerous process. Discussed diagnosis with patient and/or guardian. Patient and/or guardian aware of their diagnosis, possible red flag symptoms to watch out for and need for close  follow up. Patient and/or guardian understands verbal and written discharge instructions. Patient and/or guardian comfortable with plan and disposition.  Patient and/or guardian has a clear mental status at this time, good insight into illness (after discussion and teaching) and has clear judgment to make decisions regarding their care  Documentation was completed with the aid of voice recognition software. Transcription may contain typographical errors. Final Clinical Impressions(s) /  UC Diagnoses   Final diagnoses:  Sore throat  Viral pharyngitis     Discharge Instructions      Your strep test was negative. Your symptoms are likely due to a viral respiratory infection. A respiratory infection is an illness that affects part of the respiratory system, such as the lungs, nose, or throat. Antibiotic medicines are not prescribed for viral infections. This is because antibiotics are designed to kill bacteria. They do not kill viruses. You may take tylenol or ibuprofen as needed for fevers/headache/body aches. Drink plenty of fluids.           ED Prescriptions   None    PDMP not reviewed this encounter.   Lurline Idol, FNP 12/02/22 1440

## 2022-12-02 NOTE — Discharge Instructions (Addendum)
Your strep test was negative. Your symptoms are likely due to a viral respiratory infection. A respiratory infection is an illness that affects part of the respiratory system, such as the lungs, nose, or throat. Antibiotic medicines are not prescribed for viral infections. This is because antibiotics are designed to kill bacteria. They do not kill viruses. You may take tylenol or ibuprofen as needed for fevers/headache/body aches. Drink plenty of fluids.

## 2022-12-02 NOTE — ED Triage Notes (Signed)
Here for body aches,chills and low grade fever x 3 days. Pt states she saw tonsil stones yesterday.

## 2022-12-03 LAB — CULTURE, GROUP A STREP (THRC)

## 2022-12-05 LAB — CULTURE, GROUP A STREP (THRC)

## 2022-12-15 DIAGNOSIS — N760 Acute vaginitis: Secondary | ICD-10-CM | POA: Diagnosis not present

## 2023-01-15 DIAGNOSIS — A599 Trichomoniasis, unspecified: Secondary | ICD-10-CM | POA: Diagnosis not present
